# Patient Record
Sex: Female | Born: 1975 | Race: Black or African American | Hispanic: No | Marital: Married | Smoking: Former smoker
Health system: Southern US, Community
[De-identification: ages and names within clinical notes are randomized; demographics above are authoritative.]

## PROBLEM LIST (undated history)

## (undated) DIAGNOSIS — C801 Malignant (primary) neoplasm, unspecified: Secondary | ICD-10-CM

## (undated) DIAGNOSIS — K589 Irritable bowel syndrome without diarrhea: Secondary | ICD-10-CM

## (undated) DIAGNOSIS — L732 Hidradenitis suppurativa: Secondary | ICD-10-CM

## (undated) DIAGNOSIS — G2581 Restless legs syndrome: Secondary | ICD-10-CM

## (undated) HISTORY — PX: CHOLECYSTECTOMY: SHX55

## (undated) HISTORY — DX: Malignant (primary) neoplasm, unspecified: C80.1

## (undated) HISTORY — PX: ABDOMINAL HYSTERECTOMY: SHX81

## (undated) HISTORY — PX: TUBAL LIGATION: SHX77

---

## 1998-11-09 ENCOUNTER — Inpatient Hospital Stay (HOSPITAL_COMMUNITY): Admission: AD | Admit: 1998-11-09 | Discharge: 1998-11-09 | Payer: Self-pay | Admitting: Obstetrics & Gynecology

## 1998-11-13 ENCOUNTER — Ambulatory Visit (HOSPITAL_COMMUNITY): Admission: RE | Admit: 1998-11-13 | Discharge: 1998-11-13 | Payer: Self-pay | Admitting: Obstetrics & Gynecology

## 1998-12-01 ENCOUNTER — Inpatient Hospital Stay (HOSPITAL_COMMUNITY): Admission: AD | Admit: 1998-12-01 | Discharge: 1998-12-01 | Payer: Self-pay | Admitting: Obstetrics & Gynecology

## 1998-12-01 ENCOUNTER — Encounter: Payer: Self-pay | Admitting: Obstetrics & Gynecology

## 1999-01-02 ENCOUNTER — Ambulatory Visit (HOSPITAL_COMMUNITY): Admission: RE | Admit: 1999-01-02 | Discharge: 1999-01-02 | Payer: Self-pay | Admitting: Obstetrics & Gynecology

## 2009-04-05 ENCOUNTER — Emergency Department (HOSPITAL_COMMUNITY): Admission: EM | Admit: 2009-04-05 | Discharge: 2009-04-05 | Payer: Self-pay | Admitting: Emergency Medicine

## 2010-07-10 ENCOUNTER — Emergency Department (HOSPITAL_COMMUNITY): Payer: Self-pay

## 2010-07-10 ENCOUNTER — Emergency Department (HOSPITAL_COMMUNITY)
Admission: EM | Admit: 2010-07-10 | Discharge: 2010-07-10 | Disposition: A | Discharge status: Home / Self Care | Payer: Self-pay | Attending: Emergency Medicine | Admitting: Emergency Medicine

## 2010-07-10 DIAGNOSIS — R1012 Left upper quadrant pain: Secondary | ICD-10-CM | POA: Insufficient documentation

## 2010-07-10 DIAGNOSIS — R5381 Other malaise: Secondary | ICD-10-CM | POA: Insufficient documentation

## 2010-07-10 DIAGNOSIS — IMO0002 Reserved for concepts with insufficient information to code with codable children: Secondary | ICD-10-CM | POA: Insufficient documentation

## 2010-07-10 DIAGNOSIS — R5383 Other fatigue: Secondary | ICD-10-CM | POA: Insufficient documentation

## 2010-07-10 LAB — DIFFERENTIAL
Basophils Absolute: 0 10*3/uL (ref 0.0–0.1)
Basophils Relative: 0 % (ref 0–1)
Eosinophils Absolute: 0.1 10*3/uL (ref 0.0–0.7)
Eosinophils Relative: 2 % (ref 0–5)
Lymphocytes Relative: 21 % (ref 12–46)
Lymphs Abs: 1.3 10*3/uL (ref 0.7–4.0)
Monocytes Absolute: 0.3 10*3/uL (ref 0.1–1.0)
Monocytes Relative: 5 % (ref 3–12)
Neutro Abs: 4.7 10*3/uL (ref 1.7–7.7)
Neutrophils Relative %: 73 % (ref 43–77)

## 2010-07-10 LAB — BASIC METABOLIC PANEL
BUN: 14 mg/dL (ref 6–23)
CO2: 24 mEq/L (ref 19–32)
Chloride: 106 mEq/L (ref 96–112)
Creatinine, Ser: 0.64 mg/dL (ref 0.4–1.2)
GFR calc Af Amer: >60 mL/min (ref >60)

## 2010-07-10 LAB — CBC
HCT: 38.2 % (ref 36.0–46.0)
Hemoglobin: 12.7 g/dL (ref 12.0–15.0)
MCH: 28.7 pg (ref 26.0–34.0)
MCV: 86.4 fL (ref 78.0–100.0)
RBC: 4.42 MIL/uL (ref 3.87–5.11)

## 2011-02-22 ENCOUNTER — Emergency Department (HOSPITAL_COMMUNITY)
Admission: EM | Admit: 2011-02-22 | Discharge: 2011-02-22 | Disposition: A | Discharge status: Home / Self Care | Payer: Self-pay | Attending: Emergency Medicine | Admitting: Emergency Medicine

## 2011-02-22 DIAGNOSIS — R112 Nausea with vomiting, unspecified: Secondary | ICD-10-CM | POA: Insufficient documentation

## 2011-02-22 DIAGNOSIS — R197 Diarrhea, unspecified: Secondary | ICD-10-CM | POA: Insufficient documentation

## 2011-02-22 DIAGNOSIS — R109 Unspecified abdominal pain: Secondary | ICD-10-CM | POA: Insufficient documentation

## 2011-02-22 DIAGNOSIS — G8929 Other chronic pain: Secondary | ICD-10-CM | POA: Insufficient documentation

## 2011-02-22 LAB — DIFFERENTIAL
Basophils Absolute: 0 10*3/uL (ref 0.0–0.1)
Basophils Relative: 0 % (ref 0–1)
Eosinophils Absolute: 0.2 10*3/uL (ref 0.0–0.7)
Eosinophils Relative: 2 % (ref 0–5)
Lymphocytes Relative: 21 % (ref 12–46)
Lymphs Abs: 1.9 10*3/uL (ref 0.7–4.0)
Monocytes Absolute: 0.5 10*3/uL (ref 0.1–1.0)
Monocytes Relative: 6 % (ref 3–12)
Neutro Abs: 6.5 10*3/uL (ref 1.7–7.7)
Neutrophils Relative %: 71 % (ref 43–77)

## 2011-02-22 LAB — URINALYSIS, ROUTINE W REFLEX MICROSCOPIC
Glucose, UA: NEGATIVE mg/dL
Protein, ur: NEGATIVE mg/dL

## 2011-02-22 LAB — CBC
HCT: 40.1 % (ref 36.0–46.0)
Hemoglobin: 13.6 g/dL (ref 12.0–15.0)
MCH: 28.6 pg (ref 26.0–34.0)
MCHC: 33.9 g/dL (ref 30.0–36.0)
MCV: 84.4 fL (ref 78.0–100.0)
Platelets: 300 10*3/uL (ref 150–400)
RBC: 4.75 MIL/uL (ref 3.87–5.11)
RDW: 14.3 % (ref 11.5–15.5)
WBC: 9.1 10*3/uL (ref 4.0–10.5)

## 2011-02-22 LAB — COMPREHENSIVE METABOLIC PANEL
ALT: 11 U/L (ref 0–35)
AST: 14 U/L (ref 0–37)
Albumin: 3.9 g/dL (ref 3.5–5.2)
Alkaline Phosphatase: 72 U/L (ref 39–117)
BUN: 13 mg/dL (ref 6–23)
CO2: 25 mEq/L (ref 19–32)
Calcium: 9.4 mg/dL (ref 8.4–10.5)
Chloride: 103 mEq/L (ref 96–112)
Creatinine, Ser: 0.55 mg/dL (ref 0.50–1.10)
GFR calc Af Amer: >60 mL/min (ref >60)
GFR calc non Af Amer: >60 mL/min (ref >60)
Glucose, Bld: 80 mg/dL (ref 70–99)
Potassium: 3.9 mEq/L (ref 3.5–5.1)
Sodium: 137 mEq/L (ref 135–145)
Total Bilirubin: 0.3 mg/dL (ref 0.3–1.2)
Total Protein: 8 g/dL (ref 6.0–8.3)

## 2011-02-22 LAB — PREGNANCY, URINE: Preg Test, Ur: NEGATIVE

## 2011-02-22 LAB — URINE MICROSCOPIC-ADD ON

## 2012-03-10 ENCOUNTER — Emergency Department (HOSPITAL_BASED_OUTPATIENT_CLINIC_OR_DEPARTMENT_OTHER)
Admission: EM | Admit: 2012-03-10 | Discharge: 2012-03-10 | Disposition: A | Discharge status: Home / Self Care | Payer: Self-pay | Attending: Emergency Medicine | Admitting: Emergency Medicine

## 2012-03-10 ENCOUNTER — Encounter (HOSPITAL_BASED_OUTPATIENT_CLINIC_OR_DEPARTMENT_OTHER): Payer: Self-pay | Admitting: *Deleted

## 2012-03-10 ENCOUNTER — Emergency Department (HOSPITAL_BASED_OUTPATIENT_CLINIC_OR_DEPARTMENT_OTHER): Payer: Self-pay

## 2012-03-10 DIAGNOSIS — Z91013 Allergy to seafood: Secondary | ICD-10-CM | POA: Insufficient documentation

## 2012-03-10 DIAGNOSIS — F17200 Nicotine dependence, unspecified, uncomplicated: Secondary | ICD-10-CM | POA: Insufficient documentation

## 2012-03-10 DIAGNOSIS — W230XXA Caught, crushed, jammed, or pinched between moving objects, initial encounter: Secondary | ICD-10-CM | POA: Insufficient documentation

## 2012-03-10 DIAGNOSIS — S6720XA Crushing injury of unspecified hand, initial encounter: Secondary | ICD-10-CM

## 2012-03-10 DIAGNOSIS — IMO0002 Reserved for concepts with insufficient information to code with codable children: Secondary | ICD-10-CM

## 2012-03-10 DIAGNOSIS — S6990XA Unspecified injury of unspecified wrist, hand and finger(s), initial encounter: Secondary | ICD-10-CM | POA: Insufficient documentation

## 2012-03-10 DIAGNOSIS — Z888 Allergy status to other drugs, medicaments and biological substances status: Secondary | ICD-10-CM | POA: Insufficient documentation

## 2012-03-10 MED ORDER — IBUPROFEN 600 MG PO TABS: 600.0000 mg | ORAL_TABLET | Freq: Four times a day (QID) | ORAL | Status: DC | PRN

## 2012-03-10 MED ORDER — IBUPROFEN 800 MG PO TABS
800.0000 mg | ORAL_TABLET | Freq: Once | ORAL | Status: AC
  Administered 2012-03-10: 800 mg via ORAL
  Filled 2012-03-10: qty 1

## 2012-03-10 NOTE — ED Provider Notes (Signed)
History     CSN: [id number]  Arrival date & time 03/10/12  0015   First MD Initiated Contact with Patient 03/10/12 0034      Chief Complaint  Patient presents with  . Finger Injury    (Consider location/radiation/quality/duration/timing/severity/associated sxs/prior treatment) Patient is a 36 y.o. female presenting with hand injury. The history is provided by the patient.  Hand Injury  The incident occurred less than 1 hour ago. The incident occurred at home. The injury mechanism was compression (closed in a car door the left index and middle finger tips). The pain is present in the left hand. The quality of the pain is described as aching. The pain is severe. The pain has been constant since the incident. Pertinent negatives include no malaise/fatigue. She reports no foreign bodies present. She has tried nothing for the symptoms. The treatment provided no relief.    History reviewed. No pertinent past medical history.  History reviewed. No pertinent past surgical history.  No family history on file.  History  Substance Use Topics  . Smoking status: Current Every Day Smoker  . Smokeless tobacco: Not on file  . Alcohol Use: No    OB History    Grav Para Term Preterm Abortions TAB SAB Ect Mult Living                  Review of Systems  Constitutional: Negative for malaise/fatigue.  Skin: Positive for wound.  All other systems reviewed and are negative.    Allergies  Codeine; Ivp dye; Percocet; and Shellfish allergy  Home Medications  No current outpatient prescriptions on file.  BP 124/72  Pulse 84  Temp 98 F (36.7 C) (Oral)  Resp 18  Ht 5' (1.524 m)  Wt 184 lb (83.462 kg)  BMI 35.94 kg/m2  SpO2 100%  LMP 02/28/2012  Physical Exam  Constitutional: She is oriented to person, place, and time. She appears well-developed and well-nourished. No distress.  HENT:  Head: Normocephalic and atraumatic.  Eyes: Conjunctivae normal are normal. Pupils are equal,  round, and reactive to light.  Neck: Normal range of motion. Neck supple.  Cardiovascular: Normal rate and regular rhythm.   Pulmonary/Chest: Effort normal and breath sounds normal.  Abdominal: Soft. Bowel sounds are normal. There is no tenderness.  Musculoskeletal: Normal range of motion.       Left hand neurovascularly intact cap refill to fingers < 2 sec small superficial lac 9mm on tuft of the left index finger.  Small ecchymosis at cuticle of left index finger  FROM of the digits.  No snuff box tenderness  Neurological: She is alert and oriented to person, place, and time.  Skin: Skin is warm and dry.  Psychiatric: She has a normal mood and affect.    ED Course  Procedures (including critical care time)  Labs Reviewed - No data to display No results found.   No diagnosis found.    MDM  LACERATION REPAIR Performed by: Jasmine Awe Authorized by: Jasmine Awe Consent: Verbal consent obtained. Risks and benefits: risks, benefits and alternatives were discussed Consent given by: patient Patient identity confirmed: provided demographic data Prepped and Draped in normal sterile fashion Wound explored  Laceration Location: left index finger tuft  Laceration Length: . 9 cm  No Foreign Bodies seen or palpated   Irrigation method: syringe Amount of cleaning: standard  Skin closure: dermabond   Patient tolerance: Patient tolerated the procedure well with no immediate complications.   Will give follow up with  hand.  No indication for nail trephination at this time.  Ice and elevate     Cherylin Waguespack K Rhythm Gubbels-Rasch, MD 03/10/12 0120

## 2012-03-10 NOTE — ED Notes (Addendum)
Pt. States that she slammed her left pointer finger in her door. Swelling noted with redness to the left finger tip and pain with movment. Small less than one inch open area noted to ant aspect of finger.  Also, states left middle finger was slammed in the door as well. No injury to that finger per pt.

## 2012-03-30 ENCOUNTER — Emergency Department (HOSPITAL_BASED_OUTPATIENT_CLINIC_OR_DEPARTMENT_OTHER)
Admission: EM | Admit: 2012-03-30 | Discharge: 2012-03-30 | Disposition: A | Discharge status: Home / Self Care | Payer: Self-pay | Attending: Emergency Medicine | Admitting: Emergency Medicine

## 2012-03-30 ENCOUNTER — Encounter (HOSPITAL_BASED_OUTPATIENT_CLINIC_OR_DEPARTMENT_OTHER): Payer: Self-pay | Admitting: *Deleted

## 2012-03-30 DIAGNOSIS — K029 Dental caries, unspecified: Secondary | ICD-10-CM | POA: Insufficient documentation

## 2012-03-30 DIAGNOSIS — F17200 Nicotine dependence, unspecified, uncomplicated: Secondary | ICD-10-CM | POA: Insufficient documentation

## 2012-03-30 MED ORDER — IBUPROFEN 800 MG PO TABS: 800.0000 mg | ORAL_TABLET | Freq: Three times a day (TID) | ORAL | Status: DC

## 2012-03-30 MED ORDER — PENICILLIN V POTASSIUM 500 MG PO TABS: 500.0000 mg | ORAL_TABLET | Freq: Four times a day (QID) | ORAL | Status: AC

## 2012-03-30 NOTE — ED Notes (Signed)
C/o right upper tooth pain x 2 days.

## 2012-03-30 NOTE — ED Provider Notes (Signed)
History     CSN: [id number]  Arrival date & time 03/30/12  0121   First MD Initiated Contact with Patient 03/30/12 0134      Chief Complaint  Patient presents with  . Dental Pain    (Consider location/radiation/quality/duration/timing/severity/associated sxs/prior treatment) Patient is a 36 y.o. female presenting with tooth pain. The history is provided by the patient. No language interpreter was used.  Dental PainThe primary symptoms include mouth pain and dental injury. Primary symptoms do not include fever. The symptoms began 2 days ago. The symptoms are worsening. The symptoms are new. The symptoms occur constantly.  Additional symptoms do not include: gum swelling and facial swelling. Medical issues include: smoking. Medical issues do not include: alcohol problem.  Cavity in right upper first molar  History reviewed. No pertinent past medical history.  History reviewed. No pertinent past surgical history.  History reviewed. No pertinent family history.  History  Substance Use Topics  . Smoking status: Current Every Day Smoker  . Smokeless tobacco: Not on file  . Alcohol Use: No    OB History    Grav Para Term Preterm Abortions TAB SAB Ect Mult Living                  Review of Systems  Constitutional: Negative for fever.  HENT: Negative for facial swelling, neck pain and neck stiffness.   All other systems reviewed and are negative.    Allergies  Codeine; Ivp dye; Percocet; and Shellfish allergy  Home Medications   Current Outpatient Rx  Name Route Sig Dispense Refill  . IBUPROFEN 600 MG PO TABS Oral Take 1 tablet (600 mg total) by mouth every 6 (six) hours as needed for pain. 30 tablet 0    LMP 02/28/2012  Physical Exam  Constitutional: She is oriented to person, place, and time. She appears well-developed and well-nourished. No distress.  HENT:  Head: Normocephalic and atraumatic. No trismus in the jaw.  Mouth/Throat: Dental caries present. No  dental abscesses or uvula swelling.  Eyes: Conjunctivae normal are normal. Pupils are equal, round, and reactive to light.  Neck: Normal range of motion. Neck supple.  Cardiovascular: Normal rate and regular rhythm.   Pulmonary/Chest: Effort normal and breath sounds normal. She has no wheezes.  Abdominal: Soft. Bowel sounds are normal. There is no tenderness.  Musculoskeletal: Normal range of motion.  Neurological: She is alert and oriented to person, place, and time.  Skin: Skin is warm and dry.  Psychiatric: She has a normal mood and affect.    ED Course  Procedures (including critical care time)  Labs Reviewed - No data to display No results found.   No diagnosis found.    MDM  Follow up with a dentist for ongoing care        Aneka Fagerstrom K Fardeen Steinberger-Rasch, MD 03/30/12 [id number]

## 2013-05-14 ENCOUNTER — Encounter (HOSPITAL_COMMUNITY): Payer: Self-pay | Admitting: Emergency Medicine

## 2013-05-14 ENCOUNTER — Emergency Department (HOSPITAL_COMMUNITY): Payer: PRIVATE HEALTH INSURANCE

## 2013-05-14 ENCOUNTER — Observation Stay (HOSPITAL_COMMUNITY)
Admission: EM | Admit: 2013-05-14 | Discharge: 2013-05-15 | Disposition: A | Discharge status: Home / Self Care | Payer: PRIVATE HEALTH INSURANCE | Attending: Internal Medicine | Admitting: Internal Medicine

## 2013-05-14 DIAGNOSIS — R112 Nausea with vomiting, unspecified: Secondary | ICD-10-CM | POA: Insufficient documentation

## 2013-05-14 DIAGNOSIS — E669 Obesity, unspecified: Secondary | ICD-10-CM | POA: Insufficient documentation

## 2013-05-14 DIAGNOSIS — G43909 Migraine, unspecified, not intractable, without status migrainosus: Secondary | ICD-10-CM | POA: Insufficient documentation

## 2013-05-14 DIAGNOSIS — F17200 Nicotine dependence, unspecified, uncomplicated: Secondary | ICD-10-CM | POA: Diagnosis present

## 2013-05-14 DIAGNOSIS — R0789 Other chest pain: Secondary | ICD-10-CM | POA: Diagnosis present

## 2013-05-14 DIAGNOSIS — R262 Difficulty in walking, not elsewhere classified: Secondary | ICD-10-CM | POA: Insufficient documentation

## 2013-05-14 DIAGNOSIS — R42 Dizziness and giddiness: Principal | ICD-10-CM | POA: Diagnosis present

## 2013-05-14 LAB — CBC WITH DIFFERENTIAL/PLATELET
Basophils Relative: 0 % (ref 0–1)
HCT: 38.3 % (ref 36.0–46.0)
Hemoglobin: 12.8 g/dL (ref 12.0–15.0)
Lymphocytes Relative: 13 % (ref 12–46)
MCHC: 33.4 g/dL (ref 30.0–36.0)
Monocytes Relative: 5 % (ref 3–12)
Neutro Abs: 7.3 10*3/uL (ref 1.7–7.7)
Neutrophils Relative %: 80 % — ABNORMAL HIGH (ref 43–77)
RBC: 4.79 MIL/uL (ref 3.87–5.11)
WBC: 9.1 10*3/uL (ref 4.0–10.5)

## 2013-05-14 LAB — COMPREHENSIVE METABOLIC PANEL
ALT: 13 U/L (ref 0–35)
Albumin: 3.5 g/dL (ref 3.5–5.2)
Alkaline Phosphatase: 83 U/L (ref 39–117)
Chloride: 99 mEq/L (ref 96–112)
GFR calc Af Amer: >90 mL/min (ref >90)
Glucose, Bld: 103 mg/dL — ABNORMAL HIGH (ref 70–99)
Potassium: 3.9 mEq/L (ref 3.5–5.1)
Sodium: 137 mEq/L (ref 135–145)
Total Bilirubin: 0.2 mg/dL — ABNORMAL LOW (ref 0.3–1.2)
Total Protein: 7.7 g/dL (ref 6.0–8.3)

## 2013-05-14 LAB — CBC
HCT: 37.1 % (ref 36.0–46.0)
Hemoglobin: 12.2 g/dL (ref 12.0–15.0)
MCH: 26 pg (ref 26.0–34.0)
MCHC: 32.9 g/dL (ref 30.0–36.0)
RDW: 14.9 % (ref 11.5–15.5)
WBC: 9.2 10*3/uL (ref 4.0–10.5)

## 2013-05-14 LAB — URINALYSIS, ROUTINE W REFLEX MICROSCOPIC
Bilirubin Urine: NEGATIVE
Specific Gravity, Urine: 1.015 (ref 1.005–1.030)
pH: 6.5 (ref 5.0–8.0)

## 2013-05-14 LAB — URINE MICROSCOPIC-ADD ON

## 2013-05-14 LAB — TROPONIN I: Troponin I: <0.3 ng/mL (ref <0.30)

## 2013-05-14 LAB — CREATININE, SERUM
GFR calc Af Amer: >90 mL/min (ref >90)
GFR calc non Af Amer: >90 mL/min (ref >90)

## 2013-05-14 MED ORDER — SODIUM CHLORIDE 0.9 % IV BOLUS (SEPSIS)
1000.0000 mL | Freq: Once | INTRAVENOUS | Status: AC
  Administered 2013-05-14: 1000 mL via INTRAVENOUS

## 2013-05-14 MED ORDER — ONDANSETRON HCL 4 MG PO TABS
4.0000 mg | ORAL_TABLET | Freq: Four times a day (QID) | ORAL | Status: DC | PRN
  Administered 2013-05-15 (×2): 4 mg via ORAL
  Filled 2013-05-14 (×2): qty 1

## 2013-05-14 MED ORDER — HYDROCORTISONE SOD SUCCINATE 100 MG IJ SOLR
200.0000 mg | Freq: Once | INTRAMUSCULAR | Status: AC
  Administered 2013-05-14: 200 mg via INTRAVENOUS
  Filled 2013-05-14: qty 4

## 2013-05-14 MED ORDER — DIPHENHYDRAMINE HCL 50 MG/ML IJ SOLN
25.0000 mg | Freq: Once | INTRAMUSCULAR | Status: AC
  Administered 2013-05-14: 25 mg via INTRAVENOUS
  Filled 2013-05-14: qty 1

## 2013-05-14 MED ORDER — LORAZEPAM 2 MG/ML IJ SOLN
0.5000 mg | Freq: Once | INTRAMUSCULAR | Status: AC
  Administered 2013-05-14: 0.5 mg via INTRAVENOUS
  Filled 2013-05-14: qty 1

## 2013-05-14 MED ORDER — HEPARIN SODIUM (PORCINE) 5000 UNIT/ML IJ SOLN
5000.0000 [IU] | Freq: Three times a day (TID) | INTRAMUSCULAR | Status: DC
  Administered 2013-05-14 – 2013-05-15 (×2): 5000 [IU] via SUBCUTANEOUS
  Filled 2013-05-14 (×5): qty 1

## 2013-05-14 MED ORDER — IOHEXOL 350 MG/ML SOLN
100.0000 mL | Freq: Once | INTRAVENOUS | Status: AC | PRN
  Administered 2013-05-14: 100 mL via INTRAVENOUS

## 2013-05-14 MED ORDER — DIAZEPAM 2 MG PO TABS
1.0000 mg | ORAL_TABLET | Freq: Four times a day (QID) | ORAL | Status: DC
  Administered 2013-05-15 (×2): 1 mg via ORAL
  Filled 2013-05-14 (×2): qty 1

## 2013-05-14 MED ORDER — LORAZEPAM 2 MG/ML IJ SOLN
1.0000 mg | Freq: Once | INTRAMUSCULAR | Status: AC
  Administered 2013-05-14: 1 mg via INTRAVENOUS
  Filled 2013-05-14: qty 1

## 2013-05-14 MED ORDER — ONDANSETRON HCL 4 MG/2ML IJ SOLN
4.0000 mg | Freq: Four times a day (QID) | INTRAMUSCULAR | Status: DC | PRN
  Administered 2013-05-14: 4 mg via INTRAVENOUS
  Filled 2013-05-14: qty 2

## 2013-05-14 MED ORDER — TRAMADOL HCL 50 MG PO TABS
50.0000 mg | ORAL_TABLET | Freq: Four times a day (QID) | ORAL | Status: DC | PRN
  Administered 2013-05-14 – 2013-05-15 (×2): 50 mg via ORAL
  Filled 2013-05-14 (×2): qty 1

## 2013-05-14 MED ORDER — METOCLOPRAMIDE HCL 5 MG/ML IJ SOLN
5.0000 mg | Freq: Once | INTRAMUSCULAR | Status: AC
  Administered 2013-05-14: 5 mg via INTRAVENOUS
  Filled 2013-05-14: qty 2

## 2013-05-14 MED ORDER — NICOTINE 14 MG/24HR TD PT24
14.0000 mg | MEDICATED_PATCH | Freq: Every day | TRANSDERMAL | Status: DC
  Administered 2013-05-14 – 2013-05-15 (×2): 14 mg via TRANSDERMAL
  Filled 2013-05-14 (×3): qty 1

## 2013-05-14 MED ORDER — MECLIZINE HCL 25 MG PO TABS
25.0000 mg | ORAL_TABLET | Freq: Once | ORAL | Status: AC
  Administered 2013-05-14: 25 mg via ORAL
  Filled 2013-05-14: qty 1

## 2013-05-14 MED ORDER — SODIUM CHLORIDE 0.9 % IV SOLN
INTRAVENOUS | Status: DC
  Administered 2013-05-14: 18:00:00 via INTRAVENOUS

## 2013-05-14 NOTE — ED Provider Notes (Signed)
CSN: [id number]     Arrival date & time 05/14/13  7829 History   First MD Initiated Contact with Patient 05/14/13 0725     Chief Complaint  Patient presents with  . Dizziness  . Nausea  . Emesis   (Consider location/radiation/quality/duration/timing/severity/associated sxs/prior Treatment) Patient is a 37 y.o. female presenting with vomiting. The history is provided by the patient.  Emesis  patient complaining of sudden onset of dizziness described as room spinning with out associated headache. She then developed substernal chest pressure which is been constant and nonradiating which then progressed to nausea and vomiting. 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 Denies any lower leg pain swelling or edema. No anginal type symptoms. No recent fever or cough. Denies any night sweats. Nothing makes her symptoms better or worse and no treatment used prior to arrival  History reviewed. No pertinent past medical history. History reviewed. No pertinent past surgical history. No family history on file. History  Substance Use Topics  . Smoking status: Current Every Day Smoker  . Smokeless tobacco: Not on file  . Alcohol Use: No   OB History   Grav Para Term Preterm Abortions TAB SAB Ect Mult Living                 Review of Systems  Gastrointestinal: Positive for vomiting.  All other systems reviewed and are negative.    Allergies  Codeine; Ivp dye; Percocet; and Shellfish allergy  Home Medications   Current Outpatient Rx  Name  Route  Sig  Dispense  Refill  . traMADol (ULTRAM) 50 MG tablet   Oral   Take 50 mg by mouth every 6 (six) hours as needed.          BP 133/81  Pulse 63  Temp(Src) 98.9 F (37.2 C) (Oral)  Resp 18  SpO2 100% Physical Exam  Nursing note and vitals reviewed. Constitutional: She is oriented to person, place, and time. She appears well-developed and well-nourished.  Non-toxic appearance. No distress.  HENT:   Head: Normocephalic and atraumatic.  Eyes: Conjunctivae, EOM and lids are normal. Pupils are equal, round, and reactive to light.  Neck: Normal range of motion. Neck supple. No tracheal deviation present. No mass present.  Cardiovascular: Normal rate, regular rhythm and normal heart sounds.  Exam reveals no gallop.   No murmur heard. Pulmonary/Chest: Effort normal and breath sounds normal. No stridor. No respiratory distress. She has no decreased breath sounds. She has no wheezes. She has no rhonchi. She has no rales.  Abdominal: Soft. Normal appearance and bowel sounds are normal. She exhibits no distension. There is no tenderness. There is no rebound and no CVA tenderness.  Musculoskeletal: Normal range of motion. She exhibits no edema and no tenderness.  Neurological: She is alert and oriented to person, place, and time. She has normal strength. No cranial nerve deficit or sensory deficit. GCS eye subscore is 4. GCS verbal subscore is 5. GCS motor subscore is 6.  Skin: Skin is warm and dry. No abrasion and no rash noted.  Psychiatric: She has a normal mood and affect. Her speech is normal and behavior is normal.    ED Course  Procedures (including critical care time) Labs Review Labs Reviewed  CBC WITH DIFFERENTIAL  TROPONIN I  COMPREHENSIVE METABOLIC PANEL  LIPASE, BLOOD  URINALYSIS, ROUTINE W REFLEX MICROSCOPIC   Imaging Review No results found.  EKG Interpretation    Date/Time:  Monday May 14 2013 08:13:55  EST Ventricular Rate:  82 PR Interval:  128 QRS Duration: 100 QT Interval:  395 QTC Calculation: 461 R Axis:   -23 Text Interpretation:  Sinus rhythm Borderline left axis deviation Low voltage, precordial leads Confirmed by Areil Ottey  MD, Aengus Sauceda ([id number]) on 05/14/2013 8:18:16 AM            MDM  No diagnosis found. Patient treated for dizziness and she remains symptomatic. I have given her IV fluids, Ativan, Antivert. Patient because of her chest pain and  possible history of lymphoma had a chest CT which was negative for PE. I also subsequently did a CT of her head as well as MRI of her brain which were negative for stroke or tumor. I attempted to ambulate the patient and she was ataxic. Patient will require inpatient admission and I have discussed this with the hospitalist    Toy Baker, MD 05/14/13 [id number]

## 2013-05-14 NOTE — Progress Notes (Signed)
UR completed 

## 2013-05-14 NOTE — Progress Notes (Addendum)
   CARE MANAGEMENT ED NOTE 05/14/2013  Patient:  Jillian Baker, Jillian Baker   Account Number:  [id number]  Date Initiated:  05/14/2013  Documentation initiated by:  Edd Arbour  Subjective/Objective Assessment:   37 yr old female [employer] pt without pcp listed in EPIC States pcp is providers at [facility] urgent care     Subjective/Objective Assessment Detail:     Action/Plan:   EPIC updated    Action/Plan Detail:   Anticipated DC Date:  05/15/2013     Status Recommendation to Physician:   Result of Recommendation:    Other ED Services  Consult Working Plan    DC Planning Services  Other  PCP issues  Outpatient Services - Pt will follow up    Choice offered to / List presented to:            Status of service:  Completed, signed off  ED Comments:   ED Comments Detail:

## 2013-05-14 NOTE — Consult Note (Signed)
NEURO HOSPITALIST CONSULT NOTE    Reason for Consult: Dizziness and feeling as though she is falling to the right  HPI:                                                                                                                                          Jillian Baker is an 37 y.o. female who states she has "two lumps on her breat followed as a out patient by [facility]".  She is scheduled for a follow up imaging in three months.  Last night she bent over and upon sitting up right she felt "dizzy and then noted room was moving from left to right for about 2-3 minute".  She had just taken a Tramadol and fell asleep.  She did not wake up during the night and was not bothered by vertigo or dizziness.  Upon waking "she could not get up due to feeling dizzy" but no vertigo.  She finally sat up and started to walk to the shower.  On her way she felt as though she was falling to the right.  She states she did not fall but did lean into the right door frame.  In the shower she felt as though she was falling to the right.  During this time she denies vertigo. She finished showering and sat on her bed--at this time she noted she ad vertigo --right to left. Currently she is not having vertigo or dizziness but when she stands she feels she cannot stay upright with eyes open or closed. MRI brain with coronal DWI was obtained showed no acute infarct of either cerebellum, brainstem or cortical region.   History reviewed. No pertinent past medical history.  Past Surgical History  Procedure Laterality Date  . Tubal ligation      Family History  Problem Relation Age of Onset  . Asthma Mother   . COPD Father   . Pancreatic cancer Father   . Breast cancer Paternal Aunt   . Schizophrenia Paternal Aunt      Social History:  reports that she has been smoking Cigarettes.  She has been smoking about 0.00 packs per day. She has never used smokeless tobacco. She reports that she does  not drink alcohol or use illicit drugs.  Allergies  Allergen Reactions  . Codeine   . Ivp Dye [Iodinated Diagnostic Agents]     05-14-13-----pt pre-medicated (ER) short prep per Dr Freida Busman   . Percocet [Oxycodone-Acetaminophen]   . Shellfish Allergy     MEDICATIONS:  Current Facility-Administered Medications  Medication Dose Route Frequency Provider Last Rate Last Dose  . 0.9 %  sodium chloride infusion   Intravenous Continuous Toy Baker, MD       Current Outpatient Prescriptions  Medication Sig Dispense Refill  . traMADol (ULTRAM) 50 MG tablet Take 50 mg by mouth every 6 (six) hours as needed.          ROS:                                                                                                                                       History obtained from the patient  General ROS: negative for - chills, fatigue, fever, night sweats, weight gain or weight loss Psychological ROS: negative for - behavioral disorder, hallucinations, memory difficulties, mood swings or suicidal ideation Ophthalmic ROS: negative for - blurry vision, double vision, eye pain or loss of vision ENT ROS: negative for - epistaxis, nasal discharge, oral lesions, sore throat, tinnitus or vertigo Allergy and Immunology ROS: negative for - hives or itchy/watery eyes Hematological and Lymphatic ROS: negative for - bleeding problems, bruising or swollen lymph nodes Endocrine ROS: negative for - galactorrhea, hair pattern changes, polydipsia/polyuria or temperature intolerance Respiratory ROS: negative for - cough, hemoptysis, shortness of breath or wheezing Cardiovascular ROS: negative for - chest pain, dyspnea on exertion, edema or irregular heartbeat Gastrointestinal ROS: negative for - abdominal pain, diarrhea, hematemesis, nausea/vomiting or stool incontinence Genito-Urinary ROS:  negative for - dysuria, hematuria, incontinence or urinary frequency/urgency Musculoskeletal ROS: negative for - joint swelling or muscular weakness Neurological ROS: as noted in HPI Dermatological ROS: negative for rash and skin lesion changes   Blood pressure 117/76, pulse 96, temperature 98.2 F (36.8 C), temperature source Oral, resp. rate 16, last menstrual period 04/11/2013, SpO2 99.00%.   Neurologic Examination:                                                                                                      Mental Status: Alert, oriented, thought content appropriate.  Speech fluent without evidence of aphasia.  Able to follow 3 step commands without difficulty. Cranial Nerves: II: Discs flat bilaterally; Visual fields grossly normal, pupils equal, round, reactive to light and accommodation III,IV, VI: ptosis not present, extra-ocular motions intact bilaterally, no nystagmus noted V,VII: smile symmetric, facial light touch sensation normal bilaterally VIII: hearing normal bilaterally IX,X: gag reflex present XI: bilateral shoulder shrug XII: midline tongue extension without atrophy or fasciculations  Motor: Right :  Upper extremity   5/5    Left:     Upper extremity   5/5  Lower extremity   5/5     Lower extremity   5/5 Tone and bulk:normal tone throughout; no atrophy noted Sensory: Pinprick and light touch intact throughout, bilaterally Deep Tendon Reflexes:  Right: Upper Extremity   Left: Upper extremity   biceps (C-5 to C-6) 2/4   biceps (C-5 to C-6) 2/4 tricep (C7) 2/4    triceps (C7) 2/4 Brachioradialis (C6) 2/4  Brachioradialis (C6) 2/4  Lower Extremity Lower Extremity  quadriceps (L-2 to L-4) 2/4   quadriceps (L-2 to L-4) 2/4 Achilles (S1) 2/4   Achilles (S1) 2/4  Plantars: Right: downgoing   Left: downgoing Cerebellar: normal finger-to-nose,  normal heel-to-shin test Gait: lists to the right, falls to the right with Romberg, falls to the right with tandem  gait.  Able to sit upright in bed without falling to the right.  CV: pulses palpable throughout    No results found for this basename: cbc, bmp, coags, chol, tri, ldl, hga1c    Results for orders placed during the hospital encounter of 05/14/13 (from the past 48 hour(s))  CBC WITH DIFFERENTIAL     Status: Abnormal   Collection Time    05/14/13  7:55 AM      Result Value Range   WBC 9.1  4.0 - 10.5 K/uL   RBC 4.79  3.87 - 5.11 MIL/uL   Hemoglobin 12.8  12.0 - 15.0 g/dL   HCT 40.9  81.1 - 91.4 %   MCV 80.0  78.0 - 100.0 fL   MCH 26.7  26.0 - 34.0 pg   MCHC 33.4  30.0 - 36.0 g/dL   RDW 78.2  95.6 - 21.3 %   Platelets 369  150 - 400 K/uL   Neutrophils Relative % 80 (*) 43 - 77 %   Neutro Abs 7.3  1.7 - 7.7 K/uL   Lymphocytes Relative 13  12 - 46 %   Lymphs Abs 1.2  0.7 - 4.0 K/uL   Monocytes Relative 5  3 - 12 %   Monocytes Absolute 0.5  0.1 - 1.0 K/uL   Eosinophils Relative 2  0 - 5 %   Eosinophils Absolute 0.2  0.0 - 0.7 K/uL   Basophils Relative 0  0 - 1 %   Basophils Absolute 0.0  0.0 - 0.1 K/uL  TROPONIN I     Status: None   Collection Time    05/14/13  7:55 AM      Result Value Range   Troponin I <0.30  <0.30 ng/mL   Comment:            Due to the release kinetics of cTnI,     a negative result within the first hours     of the onset of symptoms does not rule out     myocardial infarction with certainty.     If myocardial infarction is still suspected,     repeat the test at appropriate intervals.  COMPREHENSIVE METABOLIC PANEL     Status: Abnormal   Collection Time    05/14/13  7:55 AM      Result Value Range   Sodium 137  135 - 145 mEq/L   Potassium 3.9  3.5 - 5.1 mEq/L   Chloride 99  96 - 112 mEq/L   CO2 25  19 - 32 mEq/L   Glucose, Bld 103 (*) 70 - 99 mg/dL   BUN  13  6 - 23 mg/dL   Creatinine, Ser 1.61  0.50 - 1.10 mg/dL   Calcium 9.2  8.4 - 09.6 mg/dL   Total Protein 7.7  6.0 - 8.3 g/dL   Albumin 3.5  3.5 - 5.2 g/dL   AST 16  0 - 37 U/L   ALT 13  0 -  35 U/L   Alkaline Phosphatase 83  39 - 117 U/L   Total Bilirubin 0.2 (*) 0.3 - 1.2 mg/dL   GFR calc non Af Amer >90  >90 mL/min   GFR calc Af Amer >90  >90 mL/min   Comment: (NOTE)     The eGFR has been calculated using the CKD EPI equation.     This calculation has not been validated in all clinical situations.     eGFR's persistently <90 mL/min signify possible Chronic Kidney     Disease.  LIPASE, BLOOD     Status: None   Collection Time    05/14/13  7:55 AM      Result Value Range   Lipase 16  11 - 59 U/L  URINALYSIS, ROUTINE W REFLEX MICROSCOPIC     Status: Abnormal   Collection Time    05/14/13  9:19 AM      Result Value Range   Color, Urine YELLOW  YELLOW   APPearance CLEAR  CLEAR   Specific Gravity, Urine 1.015  1.005 - 1.030   pH 6.5  5.0 - 8.0   Glucose, UA NEGATIVE  NEGATIVE mg/dL   Hgb urine dipstick TRACE (*) NEGATIVE   Bilirubin Urine NEGATIVE  NEGATIVE   Ketones, ur NEGATIVE  NEGATIVE mg/dL   Protein, ur NEGATIVE  NEGATIVE mg/dL   Urobilinogen, UA 0.2  0.0 - 1.0 mg/dL   Nitrite NEGATIVE  NEGATIVE   Leukocytes, UA NEGATIVE  NEGATIVE  URINE MICROSCOPIC-ADD ON     Status: Abnormal   Collection Time    05/14/13  9:19 AM      Result Value Range   Squamous Epithelial / LPF RARE  RARE   WBC, UA 0-2  <3 WBC/hpf   RBC / HPF 0-2  <3 RBC/hpf   Bacteria, UA FEW (*) RARE   Urine-Other MUCOUS PRESENT    POCT PREGNANCY, URINE     Status: None   Collection Time    05/14/13  9:24 AM      Result Value Range   Preg Test, Ur NEGATIVE  NEGATIVE   Comment:            THE SENSITIVITY OF THIS     METHODOLOGY IS >24 mIU/mL    Ct Head Wo Contrast  05/14/2013   CLINICAL DATA:  Headache, dizziness, nausea  EXAM: CT HEAD WITHOUT CONTRAST  TECHNIQUE: Contiguous axial images were obtained from the base of the skull through the vertex without intravenous contrast.  COMPARISON:  11/08/2008.  FINDINGS: No skull fracture is noted. Paranasal sinuses and mastoid air cells are  unremarkable. No intracranial hemorrhage, mass effect or midline shift. No hydrocephalus. The gray and white-matter differentiation is preserved. No acute infarction. No mass lesion is noted on this unenhanced scan.  IMPRESSION: No acute intracranial abnormality.   Electronically Signed   By: Natasha Mead M.D.   On: 05/14/2013 12:34   Ct Angio Chest Pe W/cm &/or Wo Cm  05/14/2013   CLINICAL DATA:  Chest pain, possible PE, dizziness  EXAM: CT ANGIOGRAPHY CHEST WITH CONTRAST  TECHNIQUE: Multidetector CT imaging of the chest was performed using the  standard protocol during bolus administration of intravenous contrast. Multiplanar CT image reconstructions including MIPs were obtained to evaluate the vascular anatomy.  CONTRAST:  OMNIPAQUE IOHEXOL 350 MG/ML SOLN  COMPARISON:  11/29/2012  FINDINGS: Sagittal images of the spine shows mild degenerative changes thoracic spine. No pulmonary embolus is noted. Central airways are patent. Images of the thoracic inlet are unremarkable. No mediastinal or hilar adenopathy. No mediastinal hematoma. Central pulmonary artery is unremarkable. No destructive bony lesions are noted. No rib lesions are identified.  Visualized upper abdomen is unremarkable.  Images of the lung parenchyma shows no acute infiltrate or pulmonary edema. No pneumothorax. No pulmonary nodules.  Review of the MIP images confirms the above findings.  IMPRESSION: 1. No pulmonary embolus.  Mild degenerative changes thoracic spine. 2. No mediastinal hematoma or adenopathy. 3. No acute infiltrate or pulmonary edema.   Electronically Signed   By: Natasha Mead M.D.   On: 05/14/2013 10:00   Mr Brain Wo Contrast  05/14/2013   CLINICAL DATA:  37 year old female with dizziness, nausea vomiting, difficulty walking x2 days. Initial encounter.  EXAM: MRI HEAD WITHOUT CONTRAST  TECHNIQUE: Multiplanar, multiecho pulse sequences of the brain and surrounding structures were obtained without intravenous contrast.   COMPARISON:  Head CTs 05/14/2013 and earlier.  FINDINGS: Cerebral volume is normal. No restricted diffusion to suggest acute infarction. No midline shift, mass effect, evidence of mass lesion, ventriculomegaly, extra-axial collection or acute intracranial hemorrhage. Cervicomedullary junction and pituitary are within normal limits. Negative visualized cervical spine. Intermittent motion artifact. Major intracranial vascular flow voids are preserved. Wallace Cullens and white matter signal is within normal limits throughout the brain.  Internal auditory structures are motion degraded, grossly normal. Mastoids are clear.  Paranasal sinuses are clear. Grossly normal orbits soft tissues. Normal bone marrow signal. Negative scalp soft tissues.  IMPRESSION: Negative noncontrast brain MRI, intermittently degraded by motion artifact.   Electronically Signed   By: Augusto Gamble M.D.   On: 05/14/2013 14:39    Assessment and plan per attending neurologist  Felicie Morn PA-C [facility] [phone]  05/14/2013, 4:33 PM   Assessment/Plan: 37 YO female with new onset dizziness and vertigo associated with sensation of falling to the right. MRI has been obtained and shows no acute infarct. Likely peripheral/vestibular in origin.    Recommend 1) low dose Valium 1 mg Q 6 hours PO for symptomatic treatment 2) PT/OT vestibular rehab.    I personally participated in this patient's evaluation and management, including formulating the above clinical impression and management recommendations.  Venetia Maxon M.D. [facility] [phone]

## 2013-05-14 NOTE — H&P (Signed)
[facility] History and Physical  Jillian Baker ZOX:[id number] DOB: 01-Nov-1975 DOA: 05/14/2013  Referring physician: Dr. Freida Busman PCP: No primary provider on file.   Chief Complaint: Vertigo  HPI: Jillian Baker is a 37 y.o. female Previously healthy African American female with smoking history of one half pack per day and obesity who presented with sudden onset of vertigo and chest discomfort. The symptoms started today and since onset has been present. Nothing she is aware of makes it better or worse. Has also been associated with chest discomfort that she describes as a pressure sensation worse with movement. The chest discomfort started today as well. Currently patient comfortable and denies any radiation. He points to her mid chest when describing location. She denies any fever, cough, hemoptysis, or recent upper respiratory infections.  In the emergency department had extensive workup including CT and MRI of the head/brain with no acute findings that could explain vertigo. Also had troponin drawn which was negative. White blood cell count was within normal limits and vital signs were within normal limits as well. We will consult at for admission given the persistent vertigo and difficulty with ambulation.  Given complaints of chest discomfort patient had CT angiogram of chest which was negative for PE, infiltrate , pulmonary edema.  Review of Systems:  Constitutional:  No weight loss, night sweats, Fevers, chills, fatigue.  HEENT:  No headaches, Difficulty swallowing,Tooth/dental problems,Sore throat,  No sneezing, itching, ear ache, nasal congestion, post nasal drip,  Cardio-vascular:  No chest pain, Orthopnea, PND, swelling in lower extremities, anasarca, dizziness, palpitations  GI:  No heartburn, indigestion, abdominal pain, nausea, vomiting, diarrhea, change in bowel habits, loss of appetite  Resp:  No shortness of breath with exertion or at rest. No excess mucus, no  productive cough, No non-productive cough, No coughing up of blood.No change in color of mucus.No wheezing.No chest wall deformity  Skin:  no rash or lesions.  GU:  no dysuria, change in color of urine, no urgency or frequency. No flank pain.  Musculoskeletal:  No joint pain or swelling. No decreased range of motion. No back pain.  Psych:  No change in mood or affect. No depression or anxiety. No memory loss.   History reviewed. No pertinent past medical history. History reviewed. No pertinent past surgical history. Social History:  reports that she has been smoking.  She does not have any smokeless tobacco history on file. She reports that she does not drink alcohol or use illicit drugs.  Allergies  Allergen Reactions  . Codeine   . Ivp Dye [Iodinated Diagnostic Agents]     05-14-13-----pt pre-medicated (ER) short prep per Dr Freida Busman   . Percocet [Oxycodone-Acetaminophen]   . Shellfish Allergy     No family history on file.   Prior to Admission medications   Medication Sig Start Date End Date Taking? Authorizing Provider  traMADol (ULTRAM) 50 MG tablet Take 50 mg by mouth every 6 (six) hours as needed.   Yes Historical Provider, MD   Physical Exam: Filed Vitals:   05/14/13 1237  BP: 117/76  Pulse: 96  Temp: 98.2 F (36.8 C)  Resp: 16    BP 117/76  Pulse 96  Temp(Src) 98.2 F (36.8 C) (Oral)  Resp 16  SpO2 99%  LMP 04/11/2013  General: Alert, awake, oriented x3, in no acute distress. Head: Atraumatic normocephalic Eyes: Nonicteric, extraocular movements intact Nose: No rhinorrhea, normal exterior appearance Neck: Supple, no goiter Heart: Regular rate and rhythm, without murmurs, rubs, gallops. Lungs: Clear  to auscultation bilaterally. Abdomen: Soft, nontender, nondistended, positive bowel sounds. Extremities: No clubbing cyanosis or edema with positive pedal pulses. Neuro: Grossly intact, nonfocal.             Labs on Admission:  Basic Metabolic  Panel:  Recent Labs Lab 05/14/13 0755  NA 137  K 3.9  CL 99  CO2 25  GLUCOSE 103*  BUN 13  CREATININE 0.68  CALCIUM 9.2   Liver Function Tests:  Recent Labs Lab 05/14/13 0755  AST 16  ALT 13  ALKPHOS 83  BILITOT 0.2*  PROT 7.7  ALBUMIN 3.5    Recent Labs Lab 05/14/13 0755  LIPASE 16   No results found for this basename: AMMONIA,  in the last 168 hours CBC:  Recent Labs Lab 05/14/13 0755  WBC 9.1  NEUTROABS 7.3  HGB 12.8  HCT 38.3  MCV 80.0  PLT 369   Cardiac Enzymes:  Recent Labs Lab 05/14/13 0755  TROPONINI <0.30    BNP (last 3 results) No results found for this basename: PROBNP,  in the last 8760 hours CBG: No results found for this basename: GLUCAP,  in the last 168 hours  Radiological Exams on Admission: Ct Head Wo Contrast  05/14/2013   CLINICAL DATA:  Headache, dizziness, nausea  EXAM: CT HEAD WITHOUT CONTRAST  TECHNIQUE: Contiguous axial images were obtained from the base of the skull through the vertex without intravenous contrast.  COMPARISON:  11/08/2008.  FINDINGS: No skull fracture is noted. Paranasal sinuses and mastoid air cells are unremarkable. No intracranial hemorrhage, mass effect or midline shift. No hydrocephalus. The gray and white-matter differentiation is preserved. No acute infarction. No mass lesion is noted on this unenhanced scan.  IMPRESSION: No acute intracranial abnormality.   Electronically Signed   By: Natasha Mead M.D.   On: 05/14/2013 12:34   Ct Angio Chest Pe W/cm &/or Wo Cm  05/14/2013   CLINICAL DATA:  Chest pain, possible PE, dizziness  EXAM: CT ANGIOGRAPHY CHEST WITH CONTRAST  TECHNIQUE: Multidetector CT imaging of the chest was performed using the standard protocol during bolus administration of intravenous contrast. Multiplanar CT image reconstructions including MIPs were obtained to evaluate the vascular anatomy.  CONTRAST:  OMNIPAQUE IOHEXOL 350 MG/ML SOLN  COMPARISON:  11/29/2012  FINDINGS: Sagittal  images of the spine shows mild degenerative changes thoracic spine. No pulmonary embolus is noted. Central airways are patent. Images of the thoracic inlet are unremarkable. No mediastinal or hilar adenopathy. No mediastinal hematoma. Central pulmonary artery is unremarkable. No destructive bony lesions are noted. No rib lesions are identified.  Visualized upper abdomen is unremarkable.  Images of the lung parenchyma shows no acute infiltrate or pulmonary edema. No pneumothorax. No pulmonary nodules.  Review of the MIP images confirms the above findings.  IMPRESSION: 1. No pulmonary embolus.  Mild degenerative changes thoracic spine. 2. No mediastinal hematoma or adenopathy. 3. No acute infiltrate or pulmonary edema.   Electronically Signed   By: Natasha Mead M.D.   On: 05/14/2013 10:00   Mr Brain Wo Contrast  05/14/2013   CLINICAL DATA:  37 year old female with dizziness, nausea vomiting, difficulty walking x2 days. Initial encounter.  EXAM: MRI HEAD WITHOUT CONTRAST  TECHNIQUE: Multiplanar, multiecho pulse sequences of the brain and surrounding structures were obtained without intravenous contrast.  COMPARISON:  Head CTs 05/14/2013 and earlier.  FINDINGS: Cerebral volume is normal. No restricted diffusion to suggest acute infarction. No midline shift, mass effect, evidence of mass lesion, ventriculomegaly, extra-axial collection  or acute intracranial hemorrhage. Cervicomedullary junction and pituitary are within normal limits. Negative visualized cervical spine. Intermittent motion artifact. Major intracranial vascular flow voids are preserved. Wallace Cullens and white matter signal is within normal limits throughout the brain.  Internal auditory structures are motion degraded, grossly normal. Mastoids are clear.  Paranasal sinuses are clear. Grossly normal orbits soft tissues. Normal bone marrow signal. Negative scalp soft tissues.  IMPRESSION: Negative noncontrast brain MRI, intermittently degraded by motion artifact.    Electronically Signed   By: Augusto Gamble M.D.   On: 05/14/2013 14:39    EKG: Independently reviewed. Sinus rhythm with no ST elevations or depressions  Assessment/Plan Active Problems:   Vertigo Chest discomfort    1. Vertigo - Etiology uncertain but at this point not a central cause. Most likely a peripheral cause given the negative CT and MRI head. - Will have physical therapy evaluate patient and make further recommendations - Also at this point we'll consult neurology to see if etiology is related to any other neurological cause, if cleared by neurology patient will require ENT evaluation which can be done as an outpatient. - Will also obtain orthostatic vital signs  2. Chest discomfort - Telemetry monitoring - Given age cardiac origin as cause unlikely but given obesity and smoking history will cycle cardiac enzymes - CT of chest negative for PE, infiltrate or pulmonary edema - Could be related to anxiety given the patient reports she has had this before with panic attacks  3. nicotine dependence -Recommended tobacco cessation - Place order for nicotine patch  4. DVT prophylaxis Heparin subcutaneous  Code Status: full Family Communication: Discussed with patient and significant other at bedside. Disposition Plan: Will have physical therapy evaluate patient and make recommendations. If cleared by neurologist we'll plan on discharging with physical therapy recommendations with ENT referral for vestibulocochlear testing.  Time spent: > 60 minutes  Penny Pia [facility] Pager [phone]

## 2013-05-14 NOTE — Progress Notes (Addendum)
Attempted to call ED RN for report.  Placed on hold for 5 minutes.  Attempted to call ED again at 18:20 with no answer.

## 2013-05-14 NOTE — ED Notes (Signed)
Pt c/o of sudden onset of dizziness, n/d when getting dressed for work this am. States that she doesn't know if its her nerves. Denies chest pain.

## 2013-05-15 LAB — TROPONIN I: Troponin I: <0.3 ng/mL (ref <0.30)

## 2013-05-15 LAB — CBC
HCT: 34.7 % — ABNORMAL LOW (ref 36.0–46.0)
Hemoglobin: 11.5 g/dL — ABNORMAL LOW (ref 12.0–15.0)
MCH: 26.4 pg (ref 26.0–34.0)
MCHC: 33.1 g/dL (ref 30.0–36.0)
MCV: 79.6 fL (ref 78.0–100.0)
RBC: 4.36 MIL/uL (ref 3.87–5.11)
RDW: 15.1 % (ref 11.5–15.5)

## 2013-05-15 LAB — BASIC METABOLIC PANEL
BUN: 14 mg/dL (ref 6–23)
CO2: 22 mEq/L (ref 19–32)
Chloride: 104 mEq/L (ref 96–112)
Creatinine, Ser: 0.6 mg/dL (ref 0.50–1.10)
GFR calc Af Amer: >90 mL/min (ref >90)
Glucose, Bld: 103 mg/dL — ABNORMAL HIGH (ref 70–99)
Potassium: 3.6 mEq/L (ref 3.5–5.1)

## 2013-05-15 MED ORDER — BUTALBITAL-APAP-CAFFEINE 50-325-40 MG PO TABS: 1.0000 | ORAL_TABLET | ORAL | Status: DC | PRN

## 2013-05-15 MED ORDER — BUTALBITAL-APAP-CAFFEINE 50-325-40 MG PO TABS: 1.0000 | ORAL_TABLET | ORAL | Status: DC

## 2013-05-15 MED ORDER — MECLIZINE HCL 25 MG PO TABS
25.0000 mg | ORAL_TABLET | ORAL | Status: AC
  Administered 2013-05-15: 25 mg via ORAL
  Filled 2013-05-15: qty 1

## 2013-05-15 MED ORDER — BUTALBITAL-APAP-CAFFEINE 50-325-40 MG PO TABS
1.0000 | ORAL_TABLET | ORAL | Status: DC | PRN
  Administered 2013-05-15: 2 via ORAL
  Filled 2013-05-15: qty 2

## 2013-05-15 MED ORDER — ONDANSETRON HCL 4 MG PO TABS: 4.0000 mg | ORAL_TABLET | Freq: Four times a day (QID) | ORAL | Status: DC | PRN

## 2013-05-15 MED ORDER — TRAMADOL HCL 50 MG PO TABS: 50.0000 mg | ORAL_TABLET | Freq: Four times a day (QID) | ORAL | Status: DC | PRN

## 2013-05-15 NOTE — Progress Notes (Signed)
RN called me that pt was confused about whether she need physical therapy or not. I called the patient in the room and have explained to her that since occupational therapy has recommended outpatient PT than she did not have to wait for physical therapy evaluation. She then mentioned she was still dizzy and that we have not done proper work up for this and if she were to go home and falls she can can sue the hospital for that. I explained to her that her CT and MRI did not reveal acute findings (which I discussed with her earlier this morning). I also explained to her that sometimes we may not find what is wrong while patient is in the hospital and that is when we defer the further evaluation to PCP. She was very insulting towards me telling me that I did not know what I was doing and am " prettty much kicking her out". I explained to her I will place stat order for meclizine to try again even though she said when she was given this medication she did not tolerated it and she vomited. I also told her will see what PT recommends once they see her prior to discharge.   Manson Passey [facility] [phone]

## 2013-05-15 NOTE — Discharge Summary (Signed)
Physician Discharge Summary  Jillian Baker KGM:[id number] DOB: Feb 11, 1976 DOA: 05/14/2013  PCP: Provider Not In System  Admit date: 05/14/2013 Discharge date: 05/15/2013  Recommendations for Outpatient Follow-up:  1. please followup with primary care physician in one to 2 weeks after discharge to make sure that your symptoms are controlled.  Discharge Diagnoses:  Principal Problem:   Vertigo Active Problems:   Chest discomfort   Nicotine dependence    Discharge Condition: Medically stable for discharge home today  Diet recommendation: As tolerated  History of present illness:  37 year old female with past medical history of active smoking and obesity who presented to [facility] ED 05/14/2013 with worsening headaches associated with vertigo and some chest discomfort. This started on the day of the admission. There was no aggravating or alleviating factors. Workup in the hospital included CT head and MRI which did not reveal acute findings. We gave patient Fioricet which helped control the pain. In addition CT anterior chest was negative for pulmonary embolus   Hospital Course:   Principal Problem:   Migraine headache - Started fioricet which helped with headaches. No allergic reaction reported  Active Problems:   Chest discomfort - PE ruled out with negative CT chest angio - No chest pain at this time   Signed:  Manson Passey, MD  [facility] 05/15/2013, 11:55 AM  Pager #: [phone]  Procedures:  None   Consultations:  Neuro   Discharge Exam: Filed Vitals:   05/15/13 0338  BP: 100/68  Pulse: 73  Temp: 98.5 F (36.9 C)  Resp: 18   Filed Vitals:   05/14/13 1840 05/14/13 2000 05/14/13 2025 05/15/13 0338  BP: 106/62  111/54 100/68  Pulse: 94  97 73  Temp: 98.4 F (36.9 C)  98.3 F (36.8 C) 98.5 F (36.9 C)  TempSrc: Oral  Oral Oral  Resp: 18 18 20 18   Height: 5' (1.524 m)     Weight: 86.5 kg (190 lb 11.2 oz)     SpO2: 100% 100% 100% 99%     General: Pt is alert, follows commands appropriately, not in acute distress Cardiovascular: Regular rate and rhythm, S1/S2 +, no murmurs, no rubs, no gallops Respiratory: Clear to auscultation bilaterally, no wheezing, no crackles, no rhonchi Abdominal: Soft, non tender, non distended, bowel sounds +, no guarding Extremities: no edema, no cyanosis, pulses palpable bilaterally DP and PT Neuro: Grossly nonfocal  Discharge Instructions  Discharge Orders   Future Orders Complete By Expires   Call MD for:  difficulty breathing, headache or visual disturbances  As directed    Call MD for:  persistant dizziness or light-headedness  As directed    Call MD for:  persistant nausea and vomiting  As directed    Call MD for:  severe uncontrolled pain  As directed    Diet - low sodium heart healthy  As directed    Discharge instructions  As directed    Comments:     1. please followup with primary care physician in one to 2 weeks after discharge to make sure that your symptoms are controlled.   Increase activity slowly  As directed        Medication List         butalbital-acetaminophen-caffeine 50-325-40 MG per tablet  Commonly known as:  FIORICET, ESGIC  Take 1-2 tablets by mouth every 4 (four) hours as needed for headache or migraine.     ondansetron 4 MG tablet  Commonly known as:  ZOFRAN  Take 1 tablet (4 mg  total) by mouth every 6 (six) hours as needed for nausea or vomiting.     traMADol 50 MG tablet  Commonly known as:  ULTRAM  Take 1 tablet (50 mg total) by mouth every 6 (six) hours as needed for moderate pain.           Follow-up Information   Follow up with Provider Not In System.       The results of significant diagnostics from this hospitalization (including imaging, microbiology, ancillary and laboratory) are listed below for reference.    Significant Diagnostic Studies: Ct Head Wo Contrast 05/14/2013   IMPRESSION: No acute intracranial abnormality.   Ct  Angio Chest Pe W/cm &/or Wo Cm 05/14/2013     IMPRESSION: 1. No pulmonary embolus.  Mild degenerative changes thoracic spine. 2. No mediastinal hematoma or adenopathy. 3. No acute infiltrate or pulmonary edema.     Mr Brain Wo Contrast 05/14/2013    IMPRESSION: Negative noncontrast brain MRI, intermittently degraded by motion artifact.      Microbiology: No results found for this or any previous visit (from the past 240 hour(s)).   Labs: Basic Metabolic Panel:  Recent Labs Lab 05/14/13 0755 05/14/13 1907 05/15/13 0620  NA 137  --  138  K 3.9  --  3.6  CL 99  --  104  CO2 25  --  22  GLUCOSE 103*  --  103*  BUN 13  --  14  CREATININE 0.68 0.55 0.60  CALCIUM 9.2  --  8.5   Liver Function Tests:  Recent Labs Lab 05/14/13 0755  AST 16  ALT 13  ALKPHOS 83  BILITOT 0.2*  PROT 7.7  ALBUMIN 3.5    Recent Labs Lab 05/14/13 0755  LIPASE 16   No results found for this basename: AMMONIA,  in the last 168 hours CBC:  Recent Labs Lab 05/14/13 0755 05/14/13 1907 05/15/13 0620  WBC 9.1 9.2 8.1  NEUTROABS 7.3  --   --   HGB 12.8 12.2 11.5*  HCT 38.3 37.1 34.7*  MCV 80.0 78.9 79.6  PLT 369 391 370   Cardiac Enzymes:  Recent Labs Lab 05/14/13 0755 05/14/13 1907 05/15/13 0110 05/15/13 0620  TROPONINI <0.30 <0.30 <0.30 <0.30   BNP: BNP (last 3 results) No results found for this basename: PROBNP,  in the last 8760 hours CBG: No results found for this basename: GLUCAP,  in the last 168 hours  Time coordinating discharge: Over 30 minutes

## 2013-05-15 NOTE — Progress Notes (Signed)
NEURO HOSPITALIST PROGRESS NOTE   SUBJECTIVE:                                                                                                                        Patient continues to have sensation to drifting to the right. She does feel slightly better.  Today she noted a posterior head/neck discomfort that was present yesterday.  No pain with movement.  She has a history of HA in the past.    OBJECTIVE:                                                                                                                           Vital signs in last 24 hours: Temp:  [98.2 F (36.8 C)-98.5 F (36.9 C)] 98.5 F (36.9 C) (12/16 0338) Pulse Rate:  [73-97] 73 (12/16 0338) Resp:  [16-20] 18 (12/16 0338) BP: (100-117)/(54-76) 100/68 mmHg (12/16 0338) SpO2:  [99 %-100 %] 99 % (12/16 0338) Weight:  [86.5 kg (190 lb 11.2 oz)] 86.5 kg (190 lb 11.2 oz) (12/15 1840)  Intake/Output from previous day: 12/15 0701 - 12/16 0700 In: 870 [P.O.:870] Out: -  Intake/Output this shift:   Nutritional status: General  History reviewed. No pertinent past medical history.   Neurologic Exam:  Mental Status: Alert, oriented, thought content appropriate.  Speech fluent without evidence of aphasia.  Able to follow 3 step commands without difficulty. Cranial Nerves: II: Discs flat bilaterally; Visual fields grossly normal, pupils equal, round, reactive to light and accommodation III,IV, VI: ptosis not present, extra-ocular motions intact bilaterally V,VII: smile symmetric, facial light touch sensation normal bilaterally VIII: hearing normal bilaterally IX,X: gag reflex present XI: bilateral shoulder shrug XII: midline tongue extension without atrophy or fasciculations  Motor: Right : Upper extremity   5/5    Left:     Upper extremity   5/5  Lower extremity   5/5     Lower extremity   5/5 Tone and bulk:normal tone throughout; no atrophy noted Sensory: Pinprick and  light touch intact throughout, bilaterally Deep Tendon Reflexes:  Right: Upper Extremity   Left: Upper extremity   biceps (C-5 to C-6) 2/4   biceps (C-5 to C-6) 2/4 tricep (C7) 2/4    triceps (C7)  2/4 Brachioradialis (C6) 2/4  Brachioradialis (C6) 2/4  Lower Extremity Lower Extremity  quadriceps (L-2 to L-4) 2/4   quadriceps (L-2 to L-4) 2/4 Achilles (S1) 2/4   Achilles (S1) 2/4  Plantars: Right: downgoing   Left: downgoing Cerebellar: normal finger-to-nose,  normal heel-to-shin test Gait: still drifting tot he right when she walks but not as pronounced.  Able to stand with both feet together without difficulty.  CV: pulses palpable throughout    Lab Results: No components found with this basename: cbc,  bmp,  coags,  chol,  tri,  ldl,  hga1c   Lipid Panel No results found for this basename: CHOL, TRIG, HDL, CHOLHDL, VLDL, LDLCALC,  in the last 72 hours  Studies/Results: Ct Head Wo Contrast  05/14/2013   CLINICAL DATA:  Headache, dizziness, nausea  EXAM: CT HEAD WITHOUT CONTRAST  TECHNIQUE: Contiguous axial images were obtained from the base of the skull through the vertex without intravenous contrast.  COMPARISON:  11/08/2008.  FINDINGS: No skull fracture is noted. Paranasal sinuses and mastoid air cells are unremarkable. No intracranial hemorrhage, mass effect or midline shift. No hydrocephalus. The gray and white-matter differentiation is preserved. No acute infarction. No mass lesion is noted on this unenhanced scan.  IMPRESSION: No acute intracranial abnormality.   Electronically Signed   By: Natasha Mead M.D.   On: 05/14/2013 12:34   Ct Angio Chest Pe W/cm &/or Wo Cm  05/14/2013   CLINICAL DATA:  Chest pain, possible PE, dizziness  EXAM: CT ANGIOGRAPHY CHEST WITH CONTRAST  TECHNIQUE: Multidetector CT imaging of the chest was performed using the standard protocol during bolus administration of intravenous contrast. Multiplanar CT image reconstructions including MIPs were obtained  to evaluate the vascular anatomy.  CONTRAST:  OMNIPAQUE IOHEXOL 350 MG/ML SOLN  COMPARISON:  11/29/2012  FINDINGS: Sagittal images of the spine shows mild degenerative changes thoracic spine. No pulmonary embolus is noted. Central airways are patent. Images of the thoracic inlet are unremarkable. No mediastinal or hilar adenopathy. No mediastinal hematoma. Central pulmonary artery is unremarkable. No destructive bony lesions are noted. No rib lesions are identified.  Visualized upper abdomen is unremarkable.  Images of the lung parenchyma shows no acute infiltrate or pulmonary edema. No pneumothorax. No pulmonary nodules.  Review of the MIP images confirms the above findings.  IMPRESSION: 1. No pulmonary embolus.  Mild degenerative changes thoracic spine. 2. No mediastinal hematoma or adenopathy. 3. No acute infiltrate or pulmonary edema.   Electronically Signed   By: Natasha Mead M.D.   On: 05/14/2013 10:00   Mr Brain Wo Contrast  05/14/2013   CLINICAL DATA:  37 year old female with dizziness, nausea vomiting, difficulty walking x2 days. Initial encounter.  EXAM: MRI HEAD WITHOUT CONTRAST  TECHNIQUE: Multiplanar, multiecho pulse sequences of the brain and surrounding structures were obtained without intravenous contrast.  COMPARISON:  Head CTs 05/14/2013 and earlier.  FINDINGS: Cerebral volume is normal. No restricted diffusion to suggest acute infarction. No midline shift, mass effect, evidence of mass lesion, ventriculomegaly, extra-axial collection or acute intracranial hemorrhage. Cervicomedullary junction and pituitary are within normal limits. Negative visualized cervical spine. Intermittent motion artifact. Major intracranial vascular flow voids are preserved. Wallace Cullens and white matter signal is within normal limits throughout the brain.  Internal auditory structures are motion degraded, grossly normal. Mastoids are clear.  Paranasal sinuses are clear. Grossly normal orbits soft tissues. Normal bone  marrow signal. Negative scalp soft tissues.  IMPRESSION: Negative noncontrast brain MRI, intermittently degraded by motion artifact.  Electronically Signed   By: Augusto Gamble M.D.   On: 05/14/2013 14:39    MEDICATIONS                                                                                                                        Scheduled: . diazepam  1 mg Oral Q6H  . heparin  5,000 Units Subcutaneous Q8H  . nicotine  14 mg Transdermal Daily    ASSESSMENT/PLAN:                                                                                                             37 YO female with new onset dizziness and vertigo associated with sensation of falling to the right. MRI has been obtained and shows no acute infarct. Likely peripheral/vestibular in origin. OT have performed vestibular evaluation and recommends out patient continued vestibular evaluation and treatment.   Recommend:  1) out patient vestibular therapy 2) symptomatic treatment of HA   Neurology S/O  Assessment and plan discussed with with attending physician and they are in agreement.    Felicie Morn PA-C [facility] [phone]  05/15/2013, 10:15 AM

## 2013-05-15 NOTE — Evaluation (Signed)
Occupational Therapy Evaluation Patient Details Name: Jillian Baker MRN: [id number] DOB: 1976/01/24 Today's Date: 05/15/2013 Time: 7829-5621 OT Time Calculation (min): 35 min  OT Assessment / Plan / Recommendation History of present illness pt was admitted with dizziness (spinning) and LOB to R   Clinical Impression   Pt was seen for Vestibular eval:  She presents with R BPPV.  Performed Epley.  Will monitor and perform additional maneuvers if necessary.     OT Assessment  Patient needs continued OT Services    Follow Up Recommendations  Other (comment) (OP vestibular program)    Barriers to Discharge      Equipment Recommendations   (close supervision/seat/grab bar in shower if needed)    Recommendations for Other Services    Frequency  Min 2X/week    Precautions / Restrictions Precautions Precautions: Fall Restrictions Weight Bearing Restrictions: No   Pertinent Vitals/Pain Pt c/o headache, back of head.  Not rated.  Asked for pain meds.  Sitting BP 111/68; standing 114/72    ADL  Transfers/Ambulation Related to ADLs: min A/guard for standing and ambulation ADL Comments: Vestibular eval:  pt's eyes are turned inward slightly.  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  Pt was very guarded with movement:  could only look at [facility] with her in control of moving head:  no blurriness.  Visual fields and gaze holding were WFLs.  Pt reports most dizziness with looking upward.  Modified Hallpike to R was positive; L was negative. Pt initially nauseas after test, but did not vomit.   Performed Epley maneuver.  Pt is overall set up for UB ADLs and min A for balance for LB ADLs    OT Diagnosis: Generalized weakness  OT Problem List: Impaired balance (sitting and/or standing);Decreased knowledge of use of DME or AE OT Treatment Interventions: Self-care/ADL training;DME and/or AE instruction;Patient/family education   OT Goals(Current goals can be found in the care plan section) Acute  Rehab OT Goals Patient Stated Goal: home today:  tomorrow is daughter's b-day OT Goal Formulation: With patient Time For Goal Achievement: 05/22/13 Potential to Achieve Goals: Good ADL Goals Additional ADL Goal #1: Pt will complete ADL with supervision, sit to stand and set up Additional ADL Goal #2: Pt will ambulate to bathroom wtih supervision and no c/o dizziness  Visit Information  Last OT Received On: 05/15/13 History of Present Illness: pt was admitted with dizziness (spinning) and LOB to R       Prior Functioning     Home Living Family/patient expects to be discharged to:: Private residence Living Arrangements: Spouse/significant other Prior Function Level of Independence: Independent 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         Vision/Perception Vision - Assessment Eye Alignment: Impaired (comment) (eyes esotrophic)   Cognition  Cognition Arousal/Alertness: Awake/alert Behavior During Therapy: WFL for tasks assessed/performed Overall Cognitive Status: Within Functional Limits for tasks assessed    Extremity/Trunk Assessment Upper Extremity Assessment Upper Extremity Assessment: Overall WFL for tasks assessed     Mobility Bed Mobility Bed Mobility: Supine to Sit;Sit to Supine;Sit to Sidelying Right;Sit to Sidelying Left Supine to Sit: 6: Modified independent (Device/Increase time) Sit to Supine: 6: Modified independent (Device/Increase time) Sit to Sidelying Right: 6: Modified independent (Device/Increase time) Sit to Sidelying Left: 6: Modified independent (Device/Increase time) Details for Bed Mobility Assistance: extra time for all Transfers Transfers: Sit to Stand;Stand to Sit Sit to Stand: 4: Min assist Stand to Sit: 4: Min assist  Details for Transfer Assistance: pt initially off balance forward, at first attempt     Exercise     Balance     End of Session OT - End of Session Activity  Tolerance: Patient tolerated treatment well Patient left: in bed;with call bell/phone within reach;with family/visitor present  GO Functional Assessment Tool Used: clinical observation/judgment Functional Limitation: Self care Self Care Current Status (O9629): At least 20 percent but less than 40 percent impaired, limited or restricted Self Care Goal Status (B2841): At least 1 percent but less than 20 percent impaired, limited or restricted   [facility], [place] 05/15/2013, 8:58 AM Marica Otter, OTR/L [phone] 05/15/2013

## 2013-05-15 NOTE — Discharge Instructions (Signed)
Migraine Headache A migraine headache is an intense, throbbing pain on one or both sides of your head. A migraine can last for 30 minutes to several hours. CAUSES  The exact cause of a migraine headache is not always known. However, a migraine may be caused when nerves in the brain become irritated and release chemicals that cause inflammation. This causes pain. SYMPTOMS  Pain on one or both sides of your head.  Pulsating or throbbing pain.  Severe pain that prevents daily activities.  Pain that is aggravated by any physical activity.  Nausea, vomiting, or both.  Dizziness.  Pain with exposure to bright lights, loud noises, or activity.  General sensitivity to bright lights, loud noises, or smells. Before you get a migraine, you may get warning signs that a migraine is coming (aura). An aura may include:  Seeing flashing lights.  Seeing bright spots, halos, or zig-zag lines.  Having tunnel vision or blurred vision.  Having feelings of numbness or tingling.  Having trouble talking.  Having muscle weakness. MIGRAINE TRIGGERS  Alcohol.  Smoking.  Stress.  Menstruation.  Aged cheeses.  Foods or drinks that contain nitrates, glutamate, aspartame, or tyramine.  Lack of sleep.  Chocolate.  Caffeine.  Hunger.  Physical exertion.  Fatigue.  Medicines used to treat chest pain (nitroglycerine), birth control pills, estrogen, and some blood pressure medicines. DIAGNOSIS  A migraine headache is often diagnosed based on:  Symptoms.  Physical examination.  A CT scan or MRI of your head. TREATMENT Medicines may be given for pain and nausea. Medicines can also be given to help prevent recurrent migraines.  HOME CARE INSTRUCTIONS  Only take over-the-counter or prescription medicines for pain or discomfort as directed by your caregiver. The use of long-term narcotics is not recommended.  Lie down in a dark, quiet room when you have a migraine.  Keep a journal  to find out what may trigger your migraine headaches. For example, write down:  What you eat and drink.  How much sleep you get.  Any change to your diet or medicines.  Limit alcohol consumption.  Quit smoking if you smoke.  Get 7 to 9 hours of sleep, or as recommended by your caregiver.  Limit stress.  Keep lights dim if bright lights bother you and make your migraines worse. SEEK IMMEDIATE MEDICAL CARE IF:   Your migraine becomes severe.  You have a fever.  You have a stiff neck.  You have vision loss.  You have muscular weakness or loss of muscle control.  You start losing your balance or have trouble walking.  You feel faint or pass out.  You have severe symptoms that are different from your first symptoms. MAKE SURE YOU:   Understand these instructions.  Will watch your condition.  Will get help right away if you are not doing well or get worse. Document Released: 05/17/2005 Document Revised: 08/09/2011 Document Reviewed: 05/07/2011 [employer] Patient Information 2014 [employer].  

## 2013-05-15 NOTE — Evaluation (Signed)
Physical Therapy Evaluation Patient Details Name: Jillian Baker MRN: [id number] DOB: 1975/12/11 Today's Date: 05/15/2013 Time: 4098-1191 PT Time Calculation (min): 51 min 1331 - 1344 then nsg admin needed to speak with family and pt 1404 - 1422  PT Assessment / Plan / Recommendation History of Present Illness  pt was admitted with dizziness (spinning) and LOB to R  Clinical Impression  Pt admitted with above. Pt currently with functional limitations due to the deficits listed below (see PT Problem List).  Pt will benefit from skilled PT to increase their independence and safety with mobility to allow discharge to the venue listed below.  Pt continues to report dizziness/spinning 10/10 with ambulation on evaluation.  Pt educated on use of RW to assist with steadying during gait.  Retested R modified Gilberto Better and still positive so performed Semont maneuver with pt.  Pt educated and provided with handout on Semont maneuver.  Pt ambulated again and reported only 5/10 dizziness during ambulation after Semont maneuver.  Will check back if remains in acute.  Recommend Outpatient Vestibular PT and RW upon d/c.  Pt also reports she has 21 steps into apt so educated to have person assist on one side and use rail while stepping up one stair at a time if d/c home today.     PT Assessment  Patient needs continued PT services    Follow Up Recommendations  Outpatient PT (vestibular rehab)    Does the patient have the potential to tolerate intense rehabilitation      Barriers to Discharge        Equipment Recommendations  Rolling walker with 5" wheels    Recommendations for Other Services     Frequency Min 3X/week    Precautions / Restrictions Precautions Precautions: Fall   Pertinent Vitals/Pain Only c/o dizziness (see below)      Mobility  Bed Mobility Bed Mobility: Supine to Sit;Sit to Supine Supine to Sit: 6: Modified independent (Device/Increase time) Sit to Supine: 6:  Modified independent (Device/Increase time) Details for Bed Mobility Assistance: increased time Transfers Transfers: Stand to Sit;Sit to Stand Sit to Stand: 4: Min assist;From bed;With upper extremity assist Stand to Sit: 4: Min guard;To bed;With upper extremity assist Details for Transfer Assistance: assist to steady upon rise Ambulation/Gait Ambulation/Gait Assistance: 4: Min assist Ambulation Distance (Feet): 100 Feet Ambulation/Gait Assistance Details: ambulated 100 feet then 80 feet after retesting for BPPV, pt reports 10/10 dizziness with first ambulation and then 5/10 dizziness with second ambulation (after Semont maneuver), more steady with RW, required bil UE assist to steady Gait Pattern: Step-through pattern;Narrow base of support;Decreased stride length Gait velocity: decreased General Gait Details: cautious, guarded gait, occasional LOB with first ambulation requiring min assist, also min assist for LOB with turns during both attemps at gait    Exercises     PT Diagnosis: Difficulty walking (R BPPV)  PT Problem List: Decreased mobility;Decreased knowledge of use of DME;Other (comment) (R BPPV) PT Treatment Interventions: DME instruction;Gait training;Stair training;Functional mobility training;Therapeutic activities;Therapeutic exercise;Patient/family education;Neuromuscular re-education;Balance training     PT Goals(Current goals can be found in the care plan section) Acute Rehab PT Goals PT Goal Formulation: With patient Time For Goal Achievement: 05/22/13 Potential to Achieve Goals: Good  Visit Information  Last PT Received On: 05/15/13 Assistance Needed: +1 History of Present Illness: pt was admitted with dizziness (spinning) and LOB to R       Prior Functioning  Home Living Family/patient expects to be discharged to:: Private residence Living Arrangements:  Spouse/significant other Available Help at Discharge: Family Type of Home: Apartment Home Access:  Stairs to enter Secretary/administrator of Steps: 21 Entrance Stairs-Rails: Right Home Layout: One level Home Equipment: Cane - single point Additional Comments: pt's mother has access to [employer] Prior Function Level of Independence: Independent 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    Cognition  Cognition Arousal/Alertness: Awake/alert Behavior During Therapy: WFL for tasks assessed/performed Overall Cognitive Status: Within Functional Limits for tasks assessed    Extremity/Trunk Assessment Lower Extremity Assessment Lower Extremity Assessment: Overall WFL for tasks assessed   Balance    End of Session PT - End of Session Equipment Utilized During Treatment: Gait belt Activity Tolerance: Other (comment) (limited by dizziness) Patient left: in bed;with call bell/phone within reach;with family/visitor present  GP Functional Assessment Tool Used: clinical judgement, positive R modified dix hallpike Functional Limitation: Mobility: Walking and moving around Mobility: Walking and Moving Around Current Status [phone]): At least 20 percent but less than 40 percent impaired, limited or restricted Mobility: Walking and Moving Around Goal Status [phone]): At least 1 percent but less than 20 percent impaired, limited or restricted   Oshay Stranahan,KATHrine E 05/15/2013, 2:48 PM Zenovia Jarred, PT, DPT 05/15/2013 Pager: [phone]

## 2013-08-16 ENCOUNTER — Emergency Department (HOSPITAL_BASED_OUTPATIENT_CLINIC_OR_DEPARTMENT_OTHER)
Admission: EM | Admit: 2013-08-16 | Discharge: 2013-08-16 | Disposition: A | Discharge status: Home / Self Care | Payer: PRIVATE HEALTH INSURANCE | Attending: Emergency Medicine | Admitting: Emergency Medicine

## 2013-08-16 ENCOUNTER — Encounter (HOSPITAL_BASED_OUTPATIENT_CLINIC_OR_DEPARTMENT_OTHER): Payer: Self-pay | Admitting: Emergency Medicine

## 2013-08-16 ENCOUNTER — Emergency Department (HOSPITAL_BASED_OUTPATIENT_CLINIC_OR_DEPARTMENT_OTHER): Payer: PRIVATE HEALTH INSURANCE

## 2013-08-16 DIAGNOSIS — X500XXA Overexertion from strenuous movement or load, initial encounter: Secondary | ICD-10-CM | POA: Insufficient documentation

## 2013-08-16 DIAGNOSIS — Y9389 Activity, other specified: Secondary | ICD-10-CM | POA: Insufficient documentation

## 2013-08-16 DIAGNOSIS — S46909A Unspecified injury of unspecified muscle, fascia and tendon at shoulder and upper arm level, unspecified arm, initial encounter: Secondary | ICD-10-CM | POA: Insufficient documentation

## 2013-08-16 DIAGNOSIS — Y929 Unspecified place or not applicable: Secondary | ICD-10-CM | POA: Insufficient documentation

## 2013-08-16 DIAGNOSIS — F17200 Nicotine dependence, unspecified, uncomplicated: Secondary | ICD-10-CM | POA: Insufficient documentation

## 2013-08-16 DIAGNOSIS — S2341XA Sprain of ribs, initial encounter: Secondary | ICD-10-CM | POA: Insufficient documentation

## 2013-08-16 DIAGNOSIS — S199XXA Unspecified injury of neck, initial encounter: Secondary | ICD-10-CM

## 2013-08-16 DIAGNOSIS — S4980XA Other specified injuries of shoulder and upper arm, unspecified arm, initial encounter: Secondary | ICD-10-CM | POA: Insufficient documentation

## 2013-08-16 DIAGNOSIS — S29011A Strain of muscle and tendon of front wall of thorax, initial encounter: Secondary | ICD-10-CM

## 2013-08-16 DIAGNOSIS — S0993XA Unspecified injury of face, initial encounter: Secondary | ICD-10-CM | POA: Insufficient documentation

## 2013-08-16 MED ORDER — IBUPROFEN 800 MG PO TABS: 800.0000 mg | ORAL_TABLET | Freq: Three times a day (TID) | ORAL | Status: DC

## 2013-08-16 MED ORDER — CYCLOBENZAPRINE HCL 10 MG PO TABS: 10.0000 mg | ORAL_TABLET | Freq: Two times a day (BID) | ORAL | Status: DC | PRN

## 2013-08-16 NOTE — ED Notes (Signed)
Lifts heavy luggage at work daily. Pt states noticed a pulling sensation Friday night in neck and upper back and now starting in center of chest.

## 2013-08-16 NOTE — Discharge Instructions (Signed)
Muscle Strain A muscle strain is an injury that occurs when a muscle is stretched beyond its normal length. Usually a small number of muscle fibers are torn when this happens. Muscle strain is rated in degrees. First-degree strains have the least amount of muscle fiber tearing and pain. Second-degree and third-degree strains have increasingly more tearing and pain.  Usually, recovery from muscle strain takes 1 2 weeks. Complete healing takes 5 6 weeks.  CAUSES  Muscle strain happens when a sudden, violent force placed on a muscle stretches it too far. This may occur with lifting, sports, or a fall.  RISK FACTORS Muscle strain is especially common in athletes.  SIGNS AND SYMPTOMS At the site of the muscle strain, there may be:  Pain.  Bruising.  Swelling.  Difficulty using the muscle due to pain or lack of normal function. DIAGNOSIS  Your health care provider will perform a physical exam and ask about your medical history. TREATMENT  Often, the best treatment for a muscle strain is resting, icing, and applying cold compresses to the injured area.  HOME CARE INSTRUCTIONS   Use the PRICE method of treatment to promote muscle healing during the first 2 3 days after your injury. The PRICE method involves:  Protecting the muscle from being injured again.  Restricting your activity and resting the injured body part.  Icing your injury. To do this, put ice in a plastic bag. Place a towel between your skin and the bag. Then, apply the ice and leave it on from 15 20 minutes each hour. After the third day, switch to moist heat packs.  Apply compression to the injured area with a splint or elastic bandage. Be careful not to wrap it too tightly. This may interfere with blood circulation or increase swelling.  Elevate the injured body part above the level of your heart as often as you can.  Only take over-the-counter or prescription medicines for pain, discomfort, or fever as directed by your  health care provider.  Warming up prior to exercise helps to prevent future muscle strains. SEEK MEDICAL CARE IF:   You have increasing pain or swelling in the injured area.  You have numbness, tingling, or a significant loss of strength in the injured area. MAKE SURE YOU:   Understand these instructions.  Will watch your condition.  Will get help right away if you are not doing well or get worse. Document Released: 05/17/2005 Document Revised: 03/07/2013 Document Reviewed: 12/14/2012 [facility] Patient Information 2014 [place], [place]. Heat Therapy Heat therapy can help ease achy, tense, stiff, and tight muscles and joints. Heat should not be used on new injuries. Wait at least 48 hours after the injury before using heat therapy. Heat also should not be used for discomfort or pain that occurs right after doing an activity. If you still have pain or stiffness 3 hours after finishing the activity, then heat therapy may be used. PRECAUTIONS  High heat or prolonged exposure to heat can cause burns. Be careful when using heat therapy to avoid burning your skin. If you have any of the following conditions, do not use heat until you have discussed heat therapy with your caregiver:  Poor circulation.  Healing wounds or scarred skin in the area being treated.  Diabetes, heart disease, or high blood pressure.  Numbness of the area being treated.  Unusual swelling of the area being treated.  Active infections.  Blood clots.  Cancer.  Inability to communicate your response to pain. This can include  young children and people with dementia. HOME CARE INSTRUCTIONS Moist heat pack  Soak a clean towel in warm water, and squeeze out the extra water. The water temperature should be comfortable to the skin.  Put the warm, wet towel in a plastic bag.  Place a thin, dry towel between your skin and the bag.  Put the heat pack on the area for 5 minutes, and check your skin. Your skin may be  pink, but it should not be red.  Leave the heat pack on the area for a total of 15 to 30 minutes.  Repeat this every 2 to 4 hours while awake. Do not use heat while you are sleeping. Warm water bath  Fill a tub with warm water. The water temperature should be comfortable to the skin.  Place the affected body part in the tub.  Soak the area for 20 to 40 minutes.  Repeat as needed. Hot water bottle  Fill the water bottle half full with hot water.  Press out the extra air. Close the cap tightly.  Place a dry towel between your skin and the bottle.  Put the bottle on the area for 5 minutes, and check your skin. Your skin may be pink, but it should not be red.  Leave the bottle on the area for a total of 15 to 30 minutes.  Repeat this every 2 to 4 hours while awake. Electric heating pad  Place a dry towel between your skin and the heating pad.  Set the heating pad on low heat.  Put the heating pad on the area for 10 minutes, and check your skin. Your skin may be pink, but it should not be red.  Leave the heating pad on the area for a total of 20 to 40 minutes.  Repeat this every 2 to 4 hours while awake.  Do not lie on the heating pad.  Do not fall asleep while using the heating pad.  Do not use the heating pad near water. Contact with water can result in an electrical shock. SEEK MEDICAL CARE IF:  You have blisters, redness, swelling, or numbness.  You have any new problems.  Your problems are getting worse.  You have any questions or concerns. If you develop any problems, stop using heat therapy until you see your caregiver. MAKE SURE YOU:  Understand these instructions.  Will watch your condition.  Will get help right away if you are not doing well or get worse. Document Released: 08/09/2011 Document Reviewed: 08/09/2011 [facility] Patient Information 2014 [place].

## 2013-08-16 NOTE — ED Notes (Signed)
Patient changed into gown from waist up.

## 2013-08-16 NOTE — ED Provider Notes (Signed)
Medical screening examination/treatment/procedure(s) were performed by non-physician practitioner and as supervising physician I was immediately available for consultation/collaboration.   EKG Interpretation   Date/Time:  Thursday August 16 2013 12:03:51 EDT Ventricular Rate:  68 PR Interval:  130 QRS Duration: 92 QT Interval:  378 QTC Calculation: 401 R Axis:   -15 Text Interpretation:  Normal sinus rhythm with sinus arrhythmia Minimal  voltage criteria for LVH, may be normal variant Borderline ECG No  significant change since last tracing Confirmed by Betsey Holiday  MD,  Desirre Eickhoff [phone]) on 08/16/2013 12:07:35 PM        Orpah Greek, MD 08/16/13 1320

## 2013-08-16 NOTE — ED Notes (Signed)
Patient transported to X-ray ambulatory with tech. 

## 2013-08-16 NOTE — ED Notes (Signed)
Pt ambulatory to restroom

## 2013-08-16 NOTE — ED Provider Notes (Signed)
CSN: [id number]     Arrival date & time 08/16/13  1154 History   First MD Initiated Contact with Patient 08/16/13 1205     Chief Complaint  Patient presents with  . Neck Pain  . Shoulder Pain  . Chest Pain     (Consider location/radiation/quality/duration/timing/severity/associated sxs/prior Treatment) HPI Comments: Patient is a 38 year-old female presenting with complaints of constant chest pain, neck pain, and back pain x 6 days. She has tried ibuprofen, heating pads and warm baths, none of which relieve her symptoms. Moving and walking make the pain worse.   The chest pain and back pain are described as a heavy, sitting 7/10 pain that does not radiate. The neck pain is described as 10/10.   The history is provided by the patient. No language interpreter was used.    History reviewed. No pertinent past medical history. Past Surgical History  Procedure Laterality Date  . Tubal ligation    . Cesarean section     Family History  Problem Relation Age of Onset  . Asthma Mother   . COPD Father   . Pancreatic cancer Father   . Breast cancer Paternal Aunt   . Schizophrenia Paternal Aunt    History  Substance Use Topics  . Smoking status: Current Every Day Smoker    Types: Cigarettes  . Smokeless tobacco: Never Used  . Alcohol Use: No   OB History   Grav Para Term Preterm Abortions TAB SAB Ect Mult Living                 Review of Systems  Constitutional: Negative for fever and chills.  HENT: Negative for congestion, ear pain and sore throat.   Respiratory: Positive for shortness of breath.        She states SOB is associated with smoking and is unchanged today.  Cardiovascular: Positive for chest pain.  Gastrointestinal: Negative for abdominal pain, diarrhea, constipation and blood in stool.  Genitourinary: Negative for dysuria, hematuria, flank pain, difficulty urinating and pelvic pain.  Musculoskeletal: Positive for neck pain and neck stiffness.  Neurological:  Negative for dizziness, syncope, light-headedness and headaches.      Allergies  Codeine; Ivp dye; Percocet; and Shellfish allergy  Home Medications   Current Outpatient Rx  Name  Route  Sig  Dispense  Refill  . butalbital-acetaminophen-caffeine (FIORICET, ESGIC) 50-325-40 MG per tablet   Oral   Take 1-2 tablets by mouth every 4 (four) hours as needed for headache or migraine.   60 tablet   0   . ondansetron (ZOFRAN) 4 MG tablet   Oral   Take 1 tablet (4 mg total) by mouth every 6 (six) hours as needed for nausea or vomiting.   60 tablet   0   . traMADol (ULTRAM) 50 MG tablet   Oral   Take 1 tablet (50 mg total) by mouth every 6 (six) hours as needed for moderate pain.   60 tablet   0    BP 133/78  Pulse 72  Temp(Src) 98.5 F (36.9 C) (Oral)  Resp 20  Wt 190 lb (86.183 kg)  SpO2 100%  LMP 08/01/2013 Physical Exam  Constitutional: She is oriented to person, place, and time. She appears well-developed and well-nourished. No distress.  Neck: Muscular tenderness present. No spinous process tenderness present.    Cardiovascular: Normal rate and regular rhythm.   No murmur heard. Pulmonary/Chest: Effort normal. She has no wheezes. She has no rales. She exhibits tenderness.  Abdominal: Soft. She exhibits no distension and no mass. There is no tenderness. There is no rebound and no guarding.  Musculoskeletal: Normal range of motion.  Neurological: She is alert and oriented to person, place, and time.  Skin: Skin is warm and dry. She is not diaphoretic.  Psychiatric: She has a normal mood and affect.    ED Course  Procedures (including critical care time) Labs Review Labs Reviewed - No data to display Imaging Review No results found.   EKG Interpretation   Date/Time:  Thursday August 16 2013 12:03:51 EDT Ventricular Rate:  68 PR Interval:  130 QRS Duration: 92 QT Interval:  378 QTC Calculation: 401 R Axis:   -15 Text Interpretation:  Normal sinus  rhythm with sinus arrhythmia Minimal  voltage criteria for LVH, may be normal variant Borderline ECG No  significant change since last tracing Confirmed by POLLINA  MD,  [place] ([id number]) on 08/16/2013 12:07:35 PM      MDM   Final diagnoses:  None    1. Muscle strain 2. Back/neck pain 3. Chest wall pain  Uncomplicated muscular pain of upper body, associated with movement. No concerning symptoms to suggest PE, ACS, PNA. Neg chest x-ray, unchanged EKG. Patient is well appearing.  Dewaine Oats, PA-C 08/16/13 1316

## 2015-03-07 ENCOUNTER — Emergency Department (HOSPITAL_BASED_OUTPATIENT_CLINIC_OR_DEPARTMENT_OTHER): Payer: 59

## 2015-03-07 ENCOUNTER — Emergency Department (HOSPITAL_BASED_OUTPATIENT_CLINIC_OR_DEPARTMENT_OTHER)
Admission: EM | Admit: 2015-03-07 | Discharge: 2015-03-07 | Disposition: A | Discharge status: Home / Self Care | Payer: 59 | Attending: Emergency Medicine | Admitting: Emergency Medicine

## 2015-03-07 ENCOUNTER — Encounter (HOSPITAL_BASED_OUTPATIENT_CLINIC_OR_DEPARTMENT_OTHER): Payer: Self-pay | Admitting: *Deleted

## 2015-03-07 DIAGNOSIS — R630 Anorexia: Secondary | ICD-10-CM | POA: Insufficient documentation

## 2015-03-07 DIAGNOSIS — Z720 Tobacco use: Secondary | ICD-10-CM | POA: Diagnosis not present

## 2015-03-07 DIAGNOSIS — Z3202 Encounter for pregnancy test, result negative: Secondary | ICD-10-CM | POA: Diagnosis not present

## 2015-03-07 DIAGNOSIS — R1031 Right lower quadrant pain: Secondary | ICD-10-CM | POA: Diagnosis present

## 2015-03-07 DIAGNOSIS — Z791 Long term (current) use of non-steroidal anti-inflammatories (NSAID): Secondary | ICD-10-CM | POA: Insufficient documentation

## 2015-03-07 DIAGNOSIS — D259 Leiomyoma of uterus, unspecified: Secondary | ICD-10-CM | POA: Insufficient documentation

## 2015-03-07 DIAGNOSIS — N83201 Unspecified ovarian cyst, right side: Secondary | ICD-10-CM | POA: Diagnosis not present

## 2015-03-07 LAB — CBC WITH DIFFERENTIAL/PLATELET
BASOS ABS: 0 10*3/uL (ref 0.0–0.1)
BASOS PCT: 0 %
EOS ABS: 0.2 10*3/uL (ref 0.0–0.7)
Eosinophils Relative: 2 %
HCT: 35 % — ABNORMAL LOW (ref 36.0–46.0)
Hemoglobin: 11.1 g/dL — ABNORMAL LOW (ref 12.0–15.0)
Lymphocytes Relative: 20 %
Lymphs Abs: 1.8 10*3/uL (ref 0.7–4.0)
MCH: 23.9 pg — ABNORMAL LOW (ref 26.0–34.0)
MCHC: 31.7 g/dL (ref 30.0–36.0)
MCV: 75.4 fL — ABNORMAL LOW (ref 78.0–100.0)
MONO ABS: 0.5 10*3/uL (ref 0.1–1.0)
MONOS PCT: 6 %
NEUTROS PCT: 72 %
Neutro Abs: 6.5 10*3/uL (ref 1.7–7.7)
Platelets: 421 10*3/uL — ABNORMAL HIGH (ref 150–400)
RBC: 4.64 MIL/uL (ref 3.87–5.11)
RDW: 16.6 % — AB (ref 11.5–15.5)
WBC: 9 10*3/uL (ref 4.0–10.5)

## 2015-03-07 LAB — URINALYSIS, ROUTINE W REFLEX MICROSCOPIC
BILIRUBIN URINE: NEGATIVE
Glucose, UA: NEGATIVE mg/dL
Hgb urine dipstick: NEGATIVE
Ketones, ur: NEGATIVE mg/dL
Leukocytes, UA: NEGATIVE
NITRITE: NEGATIVE
Protein, ur: NEGATIVE mg/dL
Specific Gravity, Urine: 1.02 (ref 1.005–1.030)
UROBILINOGEN UA: 0.2 mg/dL (ref 0.0–1.0)
pH: 7.5 (ref 5.0–8.0)

## 2015-03-07 LAB — COMPREHENSIVE METABOLIC PANEL
ALT: 17 U/L (ref 14–54)
ANION GAP: 5 (ref 5–15)
AST: 18 U/L (ref 15–41)
Albumin: 3.5 g/dL (ref 3.5–5.0)
Alkaline Phosphatase: 76 U/L (ref 38–126)
BUN: 13 mg/dL (ref 6–20)
CALCIUM: 8.6 mg/dL — AB (ref 8.9–10.3)
CO2: 27 mmol/L (ref 22–32)
Chloride: 106 mmol/L (ref 101–111)
Creatinine, Ser: 0.56 mg/dL (ref 0.44–1.00)
GFR calc Af Amer: >60 mL/min (ref >60)
GFR calc non Af Amer: >60 mL/min (ref >60)
Glucose, Bld: 111 mg/dL — ABNORMAL HIGH (ref 65–99)
Potassium: 3.4 mmol/L — ABNORMAL LOW (ref 3.5–5.1)
SODIUM: 138 mmol/L (ref 135–145)
TOTAL PROTEIN: 7.2 g/dL (ref 6.5–8.1)
Total Bilirubin: 0.2 mg/dL — ABNORMAL LOW (ref 0.3–1.2)

## 2015-03-07 LAB — URINE MICROSCOPIC-ADD ON

## 2015-03-07 LAB — PREGNANCY, URINE: Preg Test, Ur: NEGATIVE

## 2015-03-07 MED ORDER — KETOROLAC TROMETHAMINE 30 MG/ML IJ SOLN
30.0000 mg | Freq: Once | INTRAMUSCULAR | Status: AC
  Administered 2015-03-07: 30 mg via INTRAVENOUS
  Filled 2015-03-07: qty 1

## 2015-03-07 MED ORDER — SODIUM CHLORIDE 0.9 % IV BOLUS (SEPSIS)
1000.0000 mL | Freq: Once | INTRAVENOUS | Status: AC
  Administered 2015-03-07: 1000 mL via INTRAVENOUS

## 2015-03-07 MED ORDER — IOHEXOL 300 MG/ML  SOLN
100.0000 mL | Freq: Once | INTRAMUSCULAR | Status: AC | PRN
  Administered 2015-03-07: 100 mL via INTRAVENOUS

## 2015-03-07 MED ORDER — IOHEXOL 300 MG/ML  SOLN
50.0000 mL | Freq: Once | INTRAMUSCULAR | Status: AC | PRN
  Administered 2015-03-07: 50 mL via ORAL

## 2015-03-07 MED ORDER — DIPHENHYDRAMINE HCL 50 MG/ML IJ SOLN
12.5000 mg | Freq: Once | INTRAMUSCULAR | Status: AC
  Administered 2015-03-07: 12.5 mg via INTRAVENOUS
  Filled 2015-03-07: qty 1

## 2015-03-07 MED ORDER — MORPHINE SULFATE (PF) 4 MG/ML IV SOLN
4.0000 mg | Freq: Once | INTRAVENOUS | Status: AC
  Administered 2015-03-07: 4 mg via INTRAVENOUS
  Filled 2015-03-07: qty 1

## 2015-03-07 MED ORDER — METHYLPREDNISOLONE SODIUM SUCC 125 MG IJ SOLR
125.0000 mg | Freq: Once | INTRAMUSCULAR | Status: AC
  Administered 2015-03-07: 125 mg via INTRAVENOUS
  Filled 2015-03-07: qty 2

## 2015-03-07 MED ORDER — HYDROCODONE-ACETAMINOPHEN 5-325 MG PO TABS: 1.0000 | ORAL_TABLET | ORAL | Status: DC | PRN

## 2015-03-07 MED ORDER — ONDANSETRON HCL 4 MG/2ML IJ SOLN
4.0000 mg | Freq: Once | INTRAMUSCULAR | Status: AC
  Administered 2015-03-07: 4 mg via INTRAVENOUS
  Filled 2015-03-07: qty 2

## 2015-03-07 NOTE — Discharge Instructions (Signed)

## 2015-03-07 NOTE — ED Provider Notes (Addendum)
CSN: [id number]     Arrival date & time 03/07/15  1648 History   First MD Initiated Contact with Patient 03/07/15 1702     Chief Complaint  Patient presents with  . Abdominal Pain     (Consider location/radiation/quality/duration/timing/severity/associated sxs/prior Treatment) Patient is a 39 y.o. female presenting with abdominal pain. The history is provided by the patient.  Abdominal Pain Pain location:  RLQ Pain quality: aching, cramping, gnawing and sharp   Pain radiates to:  Does not radiate Pain severity:  Severe Onset quality:  Gradual Duration:  1 week Timing:  Constant Progression:  Worsening Chronicity:  New Context: not diet changes, not recent sexual activity, not suspicious food intake and not trauma   Relieved by:  Nothing Worsened by:  Movement and eating Ineffective treatments:  NSAIDs Associated symptoms: anorexia, nausea and vomiting   Associated symptoms: no chest pain, no cough, no dysuria, no fever, no vaginal bleeding and no vaginal discharge   Associated symptoms comment:  Occasional loose stool Risk factors: NSAID use   Risk factors: has not had multiple surgeries and not pregnant     History reviewed. No pertinent past medical history. Past Surgical History  Procedure Laterality Date  . Tubal ligation    . Cesarean section     Family History  Problem Relation Age of Onset  . Asthma Mother   . COPD Father   . Pancreatic cancer Father   . Breast cancer Paternal Aunt   . Schizophrenia Paternal Aunt    Social History  Substance Use Topics  . Smoking status: Current Every Day Smoker -- 0.50 packs/day    Types: Cigarettes  . Smokeless tobacco: Never Used  . Alcohol Use: No   OB History    No data available     Review of Systems  Constitutional: Negative for fever.  Respiratory: Negative for cough.   Cardiovascular: Negative for chest pain.  Gastrointestinal: Positive for nausea, vomiting, abdominal pain and anorexia.  Genitourinary:  Negative for dysuria, vaginal bleeding and vaginal discharge.  All other systems reviewed and are negative.     Allergies  Codeine; Ivp dye; Percocet; and Shellfish allergy  Home Medications   Prior to Admission medications   Medication Sig Start Date End Date Taking? Authorizing Provider  butalbital-acetaminophen-caffeine (FIORICET, ESGIC) 50-325-40 MG per tablet Take 1-2 tablets by mouth every 4 (four) hours as needed for headache or migraine. 05/15/13   Robbie Lis, MD  cyclobenzaprine (FLEXERIL) 10 MG tablet Take 1 tablet (10 mg total) by mouth 2 (two) times daily as needed for muscle spasms. 08/16/13   Charlann Lange, PA-C  ibuprofen (ADVIL,MOTRIN) 800 MG tablet Take 1 tablet (800 mg total) by mouth 3 (three) times daily. 08/16/13   Charlann Lange, PA-C  ondansetron (ZOFRAN) 4 MG tablet Take 1 tablet (4 mg total) by mouth every 6 (six) hours as needed for nausea or vomiting. 05/15/13   Robbie Lis, MD  traMADol (ULTRAM) 50 MG tablet Take 1 tablet (50 mg total) by mouth every 6 (six) hours as needed for moderate pain. 05/15/13   Robbie Lis, MD   BP 118/81 mmHg  Pulse 105  Temp(Src) 98.3 F (36.8 C) (Oral)  Resp 20  Ht 5' (1.524 m)  Wt 212 lb (96.163 kg)  BMI 41.40 kg/m2  SpO2 100%  LMP 02/16/2015 Physical Exam  Constitutional: She is oriented to person, place, and time. She appears well-developed and well-nourished. No distress.  HENT:  Head: Normocephalic and atraumatic.  Mouth/Throat:  Oropharynx is clear and moist.  Eyes: Conjunctivae and EOM are normal. Pupils are equal, round, and reactive to light.  Neck: Normal range of motion. Neck supple.  Cardiovascular: Normal rate, regular rhythm and intact distal pulses.   No murmur heard. Pulmonary/Chest: Effort normal and breath sounds normal. No respiratory distress. She has no wheezes. She has no rales.  Abdominal: Soft. Normal appearance. She exhibits no distension. There is tenderness in the right lower quadrant. There  is rebound and guarding. There is no CVA tenderness.  Musculoskeletal: Normal range of motion. She exhibits no edema or tenderness.  Neurological: She is alert and oriented to person, place, and time.  Skin: Skin is warm and dry. No rash noted. No erythema.  Psychiatric: She has a normal mood and affect. Her behavior is normal.  Nursing note and vitals reviewed.   ED Course  Procedures (including critical care time) Labs Review Labs Reviewed  URINALYSIS, ROUTINE W REFLEX MICROSCOPIC (NOT AT [facility]) - Abnormal; Notable for the following:    APPearance TURBID (*)    All other components within normal limits  CBC WITH DIFFERENTIAL/PLATELET - Abnormal; Notable for the following:    Hemoglobin 11.1 (*)    HCT 35.0 (*)    MCV 75.4 (*)    MCH 23.9 (*)    RDW 16.6 (*)    Platelets 421 (*)    All other components within normal limits  COMPREHENSIVE METABOLIC PANEL - Abnormal; Notable for the following:    Potassium 3.4 (*)    Glucose, Bld 111 (*)    Calcium 8.6 (*)    Total Bilirubin 0.2 (*)    All other components within normal limits  PREGNANCY, URINE  URINE MICROSCOPIC-ADD ON    Imaging Review Ct Abdomen Pelvis W Contrast  03/07/2015   CLINICAL DATA:  Right lower quadrant pain and tenderness for 7 days. Nausea.  EXAM: CT ABDOMEN AND PELVIS WITH CONTRAST  TECHNIQUE: Multidetector CT imaging of the abdomen and pelvis was performed using the standard protocol following bolus administration of intravenous contrast.  CONTRAST:  183mL OMNIPAQUE IOHEXOL 300 MG/ML SOLN, 1mL OMNIPAQUE IOHEXOL 300 MG/ML SOLN  COMPARISON:  None.  FINDINGS: Lower chest:  No significant abnormality  Hepatobiliary: There are normal appearances of the liver, gallbladder and bile ducts.  Pancreas: Normal  Spleen: Normal  Adrenals/Urinary Tract: The adrenals and kidneys are normal in appearance. There is no urinary calculus evident. There is no hydronephrosis or ureteral dilatation. Collecting systems and ureters appear  unremarkable.  Stomach/Bowel: The stomach, small bowel and colon are unremarkable. Minimal colonic diverticulosis noted.  Vascular/Lymphatic: The abdominal aorta is normal in caliber. There is no atherosclerotic calcification. There is no adenopathy in the abdomen or pelvis.  Reproductive: Uterus is enlarged with multiple small masses, presumably fibroids. The ovaries are remarkable only for a peripherally enhancing partially collapsed 1.7 cm cyst on the right. This is probably physiologic.  Other: There is a scant volume free pelvic fluid. No acute inflammatory changes are evident in the abdomen or pelvis.  Musculoskeletal: No significant musculoskeletal lesion.  IMPRESSION: Scant volume free pelvic fluid. Partially collapsed right ovarian cyst. Mildly enlarged multi fibroid uterus.   Electronically Signed   By: Andreas Newport M.D.   On: 03/07/2015 20:17   I have personally reviewed and evaluated these images and lab results as part of my medical decision-making.   EKG Interpretation None      MDM   Final diagnoses:  Right ovarian cyst  Uterine leiomyoma, unspecified  location    Patient is a 39 year old female with a significant medical history for 2 C-sections and a tubal ligation presents today with 7 days of worsening abdominal pain. Patient saw her PCP today and sent here for further care. Significant right lower quadrant tenderness on exam with guarding and rebound. Significant decreased by mouth intake and nausea with occasional diarrhea. No signs or symptoms of obstruction.  With patient's exam and history concern for appendicitis versus right-sided diverticulitis versus ruptured ovarian cyst. Patient has not been sexually active for over 3 years so low suspicion for pregnancy for STI. She denies any vaginal discharge or dysuria. Low suspicion for kidney stone.  CBC, CMP, UA, CT abdomen and pelvis pending. The CT will be with by mouth contrast only due to an IV dye allergy.  8:26  PM Labs are within normal limits. CT of the abdomen shows mild free fluid and partially collapsed right ovarian cyst with multiple enlarged uterine fibroids which are most likely the result of her pain. Will have patient follow-up with OB/GYN. She was given pain control.  Low suspicion for ovarian torsion at this time due to length of sx and no signs of ischemia today to ovary.  Blanchie Dessert, MD 03/07/15 2027  Blanchie Dessert, MD 03/07/15 2041

## 2015-03-07 NOTE — ED Notes (Signed)
Right lower quadrant pain x 7 days.

## 2015-03-08 NOTE — ED Notes (Signed)
Patient called and stated she was having an itching reaction to hydrocodone. Called in a prescription for tramadol 50mg  Q6hrs prn, 15 tabs, no refills per Dr Colin Rhein.

## 2015-07-06 ENCOUNTER — Emergency Department (HOSPITAL_BASED_OUTPATIENT_CLINIC_OR_DEPARTMENT_OTHER)
Admission: EM | Admit: 2015-07-06 | Discharge: 2015-07-06 | Disposition: A | Discharge status: Home / Self Care | Payer: 59 | Attending: Emergency Medicine | Admitting: Emergency Medicine

## 2015-07-06 ENCOUNTER — Encounter (HOSPITAL_BASED_OUTPATIENT_CLINIC_OR_DEPARTMENT_OTHER): Payer: Self-pay | Admitting: *Deleted

## 2015-07-06 DIAGNOSIS — F17210 Nicotine dependence, cigarettes, uncomplicated: Secondary | ICD-10-CM | POA: Diagnosis not present

## 2015-07-06 DIAGNOSIS — N390 Urinary tract infection, site not specified: Secondary | ICD-10-CM | POA: Diagnosis not present

## 2015-07-06 DIAGNOSIS — R300 Dysuria: Secondary | ICD-10-CM | POA: Diagnosis present

## 2015-07-06 LAB — URINALYSIS, ROUTINE W REFLEX MICROSCOPIC
Bilirubin Urine: NEGATIVE
GLUCOSE, UA: NEGATIVE mg/dL
KETONES UR: NEGATIVE mg/dL
NITRITE: NEGATIVE
PROTEIN: NEGATIVE mg/dL
Specific Gravity, Urine: 1.027 (ref 1.005–1.030)
pH: 6 (ref 5.0–8.0)

## 2015-07-06 LAB — URINE MICROSCOPIC-ADD ON

## 2015-07-06 MED ORDER — CEPHALEXIN 500 MG PO CAPS: 500.0000 mg | ORAL_CAPSULE | Freq: Two times a day (BID) | ORAL | Status: DC

## 2015-07-06 NOTE — ED Notes (Signed)
Dysuria and retention x 4 days.  Azo and amoxicillin(left over from previous dental infection) taken PTA.

## 2015-07-06 NOTE — ED Notes (Signed)
MD at bedside. 

## 2015-07-06 NOTE — ED Provider Notes (Signed)
CSN: [id number]     Arrival date & time 07/06/15  1413 History  By signing my name below, I, Jillian Baker, attest that this documentation has been prepared under the direction and in the presence of Jillian Belling, MD. Electronically Signed: Altamease Baker, ED Scribe. 07/06/2015. 4:47 PM  Chief Complaint  Patient presents with  . Dysuria   The history is provided by the patient. No language interpreter was used.   Jillian Baker is a 40 y.o. female who presents to the Emergency Department complaining of new dysuria with onset 4 days ago. Pt states that she has burning at the end of urination. She has been using left over Amoxicillin at home without relief. Associated symptoms include bilateral lower back pain. Pt denies vaginal bleeding and discharge. She also denies risk of STD.   History reviewed. No pertinent past medical history. Past Surgical History  Procedure Laterality Date  . Tubal ligation    . Cesarean section    . Abdominal hysterectomy     Family History  Problem Relation Age of Onset  . Asthma Mother   . COPD Father   . Pancreatic cancer Father   . Breast cancer Paternal Aunt   . Schizophrenia Paternal Aunt    Social History  Substance Use Topics  . Smoking status: Current Every Day Smoker -- 0.50 packs/day    Types: Cigarettes  . Smokeless tobacco: Never Used  . Alcohol Use: No   OB History    No data available     Review of Systems  Genitourinary: Positive for dysuria. Negative for vaginal bleeding and vaginal discharge.  Musculoskeletal: Positive for back pain.  All other systems reviewed and are negative.  Allergies  Codeine; Ivp dye; Percocet; and Shellfish allergy  Home Medications   Prior to Admission medications   Medication Sig Start Date End Date Taking? Authorizing Provider  cephALEXin (KEFLEX) 500 MG capsule Take 1 capsule (500 mg total) by mouth 2 (two) times daily. 07/06/15   Jillian Belling, MD   BP 114/88 mmHg  Pulse 103   Temp(Src) 98.5 F (36.9 C) (Oral)  Resp 18  Ht 5' (1.524 m)  Wt 210 lb (95.255 kg)  BMI 41.01 kg/m2  SpO2 94%  LMP 02/16/2015 Physical Exam  Constitutional: She is oriented to person, place, and time. She appears well-developed and well-nourished. No distress.  HENT:  Head: Normocephalic and atraumatic.  Eyes: EOM are normal.  Neck: Normal range of motion.  Cardiovascular: Normal rate, regular rhythm and normal heart sounds.   Pulmonary/Chest: Effort normal and breath sounds normal.  Abdominal: Soft. She exhibits no distension. There is tenderness (mild) in the suprapubic area. There is no CVA tenderness.  Musculoskeletal: Normal range of motion.  Neurological: She is alert and oriented to person, place, and time.  Skin: Skin is warm and dry.  Psychiatric: She has a normal mood and affect. Judgment normal.  Nursing note and vitals reviewed.   ED Course  Procedures (including critical care time) COORDINATION OF CARE: 4:46 PM Discussed treatment plan which includes UA with pt at bedside and pt agreed to plan.  Labs Review Labs Reviewed  URINALYSIS, ROUTINE W REFLEX MICROSCOPIC (NOT AT [facility]) - Abnormal; Notable for the following:    APPearance CLOUDY (*)    Hgb urine dipstick MODERATE (*)    Leukocytes, UA MODERATE (*)    All other components within normal limits  URINE MICROSCOPIC-ADD ON - Abnormal; Notable for the following:    Squamous Epithelial / LPF 0-5 (*)  Bacteria, UA MANY (*)    All other components within normal limits  URINE CULTURE    Imaging Review No results found. I have personally reviewed and evaluated these lab results as part of my medical decision-making.   EKG Interpretation None      MDM   Final diagnoses:  UTI (lower urinary tract infection)    Patient with UTI. Has been on amoxicillin and will change to Keflex. Culture sent. Will discharge home.  I personally performed the services described in this documentation, which was scribed  in my presence. The recorded information has been reviewed and is accurate.      Jillian Belling, MD 07/07/15 [id number]

## 2015-07-06 NOTE — ED Notes (Signed)
DC instructions and Rx reviewed with pt, pt teaching done re: completing all Rx as written by EDP, importance of handwashing, to avoid sitting in bathtub until infection has resolved and importance of increasing PO fluids. Opportunity for questions provided. Teach Back Method used

## 2015-07-06 NOTE — Discharge Instructions (Signed)

## 2015-07-06 NOTE — ED Notes (Signed)
Pt states has been taking amoxicillin for tooth infection and has used this secondary to UTI, EDP at bedside, urine culture ordered and implemented per EDP orders

## 2015-07-08 LAB — URINE CULTURE: Culture: 30000

## 2017-11-11 ENCOUNTER — Ambulatory Visit (HOSPITAL_COMMUNITY): Payer: Self-pay | Admitting: Psychiatry

## 2018-02-18 ENCOUNTER — Other Ambulatory Visit: Payer: Self-pay

## 2018-02-18 ENCOUNTER — Emergency Department (HOSPITAL_BASED_OUTPATIENT_CLINIC_OR_DEPARTMENT_OTHER): Payer: 59

## 2018-02-18 ENCOUNTER — Encounter (HOSPITAL_BASED_OUTPATIENT_CLINIC_OR_DEPARTMENT_OTHER): Payer: Self-pay | Admitting: Emergency Medicine

## 2018-02-18 ENCOUNTER — Emergency Department (HOSPITAL_BASED_OUTPATIENT_CLINIC_OR_DEPARTMENT_OTHER)
Admission: EM | Admit: 2018-02-18 | Discharge: 2018-02-18 | Disposition: A | Discharge status: Home / Self Care | Payer: 59 | Attending: Emergency Medicine | Admitting: Emergency Medicine

## 2018-02-18 DIAGNOSIS — R200 Anesthesia of skin: Secondary | ICD-10-CM | POA: Diagnosis not present

## 2018-02-18 DIAGNOSIS — F17210 Nicotine dependence, cigarettes, uncomplicated: Secondary | ICD-10-CM | POA: Insufficient documentation

## 2018-02-18 DIAGNOSIS — R202 Paresthesia of skin: Secondary | ICD-10-CM

## 2018-02-18 DIAGNOSIS — H538 Other visual disturbances: Secondary | ICD-10-CM | POA: Insufficient documentation

## 2018-02-18 DIAGNOSIS — Z79899 Other long term (current) drug therapy: Secondary | ICD-10-CM | POA: Diagnosis not present

## 2018-02-18 DIAGNOSIS — R531 Weakness: Secondary | ICD-10-CM | POA: Diagnosis present

## 2018-02-18 DIAGNOSIS — R079 Chest pain, unspecified: Secondary | ICD-10-CM | POA: Diagnosis not present

## 2018-02-18 HISTORY — DX: Restless legs syndrome: G25.81

## 2018-02-18 LAB — COMPREHENSIVE METABOLIC PANEL
ALK PHOS: 72 U/L (ref 38–126)
ALT: 16 U/L (ref 0–44)
AST: 17 U/L (ref 15–41)
Albumin: 3.8 g/dL (ref 3.5–5.0)
Anion gap: 7 (ref 5–15)
BILIRUBIN TOTAL: 0.3 mg/dL (ref 0.3–1.2)
BUN: 10 mg/dL (ref 6–20)
CALCIUM: 8.7 mg/dL — AB (ref 8.9–10.3)
CHLORIDE: 102 mmol/L (ref 98–111)
CO2: 26 mmol/L (ref 22–32)
Creatinine, Ser: 0.51 mg/dL (ref 0.44–1.00)
GFR calc Af Amer: >60 mL/min (ref >60)
Glucose, Bld: 88 mg/dL (ref 70–99)
Potassium: 3.7 mmol/L (ref 3.5–5.1)
SODIUM: 135 mmol/L (ref 135–145)
TOTAL PROTEIN: 7.6 g/dL (ref 6.5–8.1)

## 2018-02-18 LAB — DIFFERENTIAL
BASOS PCT: 0 %
Basophils Absolute: 0 10*3/uL (ref 0.0–0.1)
Eosinophils Absolute: 0.2 10*3/uL (ref 0.0–0.7)
Eosinophils Relative: 2 %
Lymphocytes Relative: 17 %
Lymphs Abs: 1.8 10*3/uL (ref 0.7–4.0)
MONO ABS: 0.7 10*3/uL (ref 0.1–1.0)
MONOS PCT: 7 %
Neutro Abs: 8 10*3/uL — ABNORMAL HIGH (ref 1.7–7.7)
Neutrophils Relative %: 74 %

## 2018-02-18 LAB — CBC
HEMATOCRIT: 40.3 % (ref 36.0–46.0)
Hemoglobin: 13.4 g/dL (ref 12.0–15.0)
MCH: 27.7 pg (ref 26.0–34.0)
MCHC: 33.3 g/dL (ref 30.0–36.0)
MCV: 83.4 fL (ref 78.0–100.0)
PLATELETS: 427 10*3/uL — AB (ref 150–400)
RBC: 4.83 MIL/uL (ref 3.87–5.11)
RDW: 14.9 % (ref 11.5–15.5)
WBC: 10.7 10*3/uL — AB (ref 4.0–10.5)

## 2018-02-18 LAB — TROPONIN I: Troponin I: <0.03 ng/mL (ref <0.03)

## 2018-02-18 LAB — PROTIME-INR
INR: 0.97
PROTHROMBIN TIME: 12.8 s (ref 11.4–15.2)

## 2018-02-18 LAB — APTT: aPTT: 36 seconds (ref 24–36)

## 2018-02-18 NOTE — ED Triage Notes (Signed)
Patient states that she has been "foggy" for the last 2 -3 days and states that she  Started to have right sided numbness and weakness tarted last night - went to the urgent care today and was sent her for abnormal strengths

## 2018-02-18 NOTE — ED Notes (Signed)
Pt in MRI.

## 2018-02-18 NOTE — ED Provider Notes (Addendum)
[facility] EMERGENCY DEPARTMENT Provider Note   CSN: [id number] Arrival date & time: 02/18/18  1710     History   Chief Complaint Chief Complaint  Patient presents with  . Extremity Weakness    HPI Jillian Baker is a 42 y.o. female.  Patient is a 42 year old female with no significant past medical history who presents with right side numbness and weakness.  She states that about 5 days ago she started noticing some fogginess and that she was forgetting things at work.  She then started noticing some numbness and tingling to the right side of her face which includes the forehead.  This started about 2 to 3 days ago.  Around the same time she noticed that she was having some tingling in her right arm and right leg.  She feels like the right arm and right leg is weaker than normal as well.  She also notes some discomfort in the forearm of her right arm.  She notes some soreness in her right chest and right shoulder.  She denies any visible swelling.  She denies any difficulty with her balance.  She does note some blurry vision in her right eye.  She denies any speech deficits.  No history of similar symptoms in the past.  She has a history of migraines but denies any current headache.     Past Medical History:  Diagnosis Date  . Restless leg     Patient Active Problem List   Diagnosis Date Noted  . Vertigo 05/14/2013  . Chest discomfort 05/14/2013  . Nicotine dependence 05/14/2013    Past Surgical History:  Procedure Laterality Date  . ABDOMINAL HYSTERECTOMY    . CESAREAN SECTION    . TUBAL LIGATION       OB History   None      Home Medications    Prior to Admission medications   Medication Sig Start Date End Date Taking? Authorizing Provider  sertraline (ZOLOFT) 100 MG tablet Take 100 mg by mouth daily.   Yes [provider]  tizanidine (ZANAFLEX) 2 MG capsule Take 2 mg by mouth 3 (three) times daily.   Yes [provider]  traMADol  (ULTRAM) 50 MG tablet Take by mouth every 6 (six) hours as needed.   Yes [provider]  cephALEXin (KEFLEX) 500 MG capsule Take 1 capsule (500 mg total) by mouth 2 (two) times daily. 07/06/15   Davonna Belling, MD    Family History Family History  Problem Relation Age of Onset  . Asthma Mother   . COPD Father   . Pancreatic cancer Father   . Breast cancer Paternal Aunt   . Schizophrenia Paternal Aunt     Social History Social History   Tobacco Use  . Smoking status: Current Every Day Smoker    Packs/day: 0.50    Types: Cigarettes  . Smokeless tobacco: Never Used  Substance Use Topics  . Alcohol use: No  . Drug use: No     Allergies   Codeine; Ivp dye [iodinated diagnostic agents]; Percocet [oxycodone-acetaminophen]; and Shellfish allergy   Review of Systems Review of Systems  Constitutional: Negative for chills, diaphoresis, fatigue and fever.  HENT: Negative for congestion, rhinorrhea and sneezing.   Eyes: Negative.   Respiratory: Negative for cough, chest tightness and shortness of breath.   Cardiovascular: Positive for chest pain. Negative for leg swelling.  Gastrointestinal: Negative for abdominal pain, blood in stool, diarrhea, nausea and vomiting.  Genitourinary: Negative for difficulty urinating, flank  pain, frequency and hematuria.  Musculoskeletal: Positive for arthralgias. Negative for back pain.  Skin: Negative for rash.  Neurological: Positive for weakness and numbness. Negative for dizziness, speech difficulty and headaches.     Physical Exam Updated Vital Signs BP 124/90 (BP Location: Left Arm)   Pulse 80   Temp 98.7 F (37.1 C) (Oral)   Resp 18   Ht 5' (1.524 m)   Wt 97.5 kg   LMP 02/16/2015   SpO2 100%   BMI 41.99 kg/m   Physical Exam  Constitutional: She is oriented to person, place, and time. She appears well-developed and well-nourished.  HENT:  Head: Normocephalic and atraumatic.  Eyes: Pupils are equal, round, and  reactive to light.  Neck: Normal range of motion. Neck supple.  Cardiovascular: Normal rate, regular rhythm and normal heart sounds.  Pulmonary/Chest: Effort normal and breath sounds normal. No respiratory distress. She has no wheezes. She has no rales. She exhibits tenderness.  Patient has some slight reproducible tenderness to the right chest wall, no crepitus or deformity  Abdominal: Soft. Bowel sounds are normal. There is no tenderness. There is no rebound and no guarding.  Musculoskeletal: Normal range of motion. She exhibits no edema.  Lymphadenopathy:    She has no cervical adenopathy.  Neurological: She is alert and oriented to person, place, and time.  Patient has 5 out of 5 strength in all extremities, it seems slightly reduced in the right upper extremity in the right lower extremity although her exam is not consistent.  She has some subjective numbness to light touch in the right upper extremity in the right lower extremity as compared to the left.  She also has some diminished sensation to light touch in the right side of the face, including the forehead.  No facial drooping, extraocular eye movements are intact, she has a diminished shoulder shrug but I question her effort in this.  No other cranial nerve abnormalities are noted.  Finger-nose is intact, no pronator drift  Skin: Skin is warm and dry. No rash noted.  Psychiatric: She has a normal mood and affect.     ED Treatments / Results  Labs (all labs ordered are listed, but only abnormal results are displayed) Labs Reviewed  CBC - Abnormal; Notable for the following components:      Result Value   WBC 10.7 (*)    Platelets 427 (*)    All other components within normal limits  DIFFERENTIAL - Abnormal; Notable for the following components:   Neutro Abs 8.0 (*)    All other components within normal limits  COMPREHENSIVE METABOLIC PANEL - Abnormal; Notable for the following components:   Calcium 8.7 (*)    All other  components within normal limits  PROTIME-INR  APTT  TROPONIN I  CBG MONITORING, ED    EKG EKG Interpretation  Date/Time:  Saturday February 18 2018 17:32:07 EDT Ventricular Rate:  83 PR Interval:    QRS Duration: 93 QT Interval:  355 QTC Calculation: 418 R Axis:   -30 Text Interpretation:  Sinus rhythm Left axis deviation Low voltage, precordial leads Consider anterior infarct since last tracing no significant change Confirmed by Malvin Johns [phone]) on 02/18/2018 5:35:02 PM   Radiology Mr Brain Wo Contrast  Result Date: 02/18/2018 CLINICAL DATA:  RIGHT-sided facial tingling, RIGHT arm numbness and weakness. Visual disturbance. EXAM: MRI HEAD WITHOUT CONTRAST TECHNIQUE: Multiplanar, multiecho pulse sequences of the brain and surrounding structures were obtained without intravenous contrast. COMPARISON:  MRI 05/14/2013. FINDINGS:  Brain: No acute infarction, hemorrhage, hydrocephalus, extra-axial collection or mass lesion. Normal cerebral volume. No white matter disease. Vascular: Normal flow voids. Skull and upper cervical spine: Normal marrow signal. Sinuses/Orbits: Negative. Other: None. IMPRESSION: Negative exam.  No change from priors. Electronically Signed   By: Staci Righter M.D.   On: 02/18/2018 19:02    Procedures Procedures (including critical care time)  Medications Ordered in ED Medications - No data to display   Initial Impression / Assessment and Plan / ED Course  I have reviewed the triage vital signs and the nursing notes.  Pertinent labs & imaging results that were available during my care of the patient were reviewed by me and considered in my medical decision making (see chart for details).     Patient is a 42 year old female who presents with right arm and leg numbness and weakness with right face numbness.  Her exam is not very consistent.  She does not seem to be completely cooperating with exam.  She did have an MRI which shows no evidence of acute  infarct, mass or other abnormality.  She is otherwise well-appearing.  She was tearful on arrival and I question whether this is due to somatization.  Her labs are non-concerning.  She is able to ambulate without any ataxia or evident weakness.  She was discharged home in good condition.  She was encouraged to have close follow-up with her PCP.  Return precautions were given.  She did request a sling for her right arm and complains of pain to the arm.  She has no specific bony tenderness.  No evidence of vascular compromise.  Full ROM in arm.  Final Clinical Impressions(s) / ED Diagnoses   Final diagnoses:  Paresthesias    ED Discharge Orders    None       Malvin Johns, MD 02/18/18 1956    Malvin Johns, MD 02/18/18 [phone]

## 2018-04-07 ENCOUNTER — Ambulatory Visit (INDEPENDENT_AMBULATORY_CARE_PROVIDER_SITE_OTHER): Payer: Self-pay | Admitting: Orthopaedic Surgery

## 2019-05-17 ENCOUNTER — Ambulatory Visit (INDEPENDENT_AMBULATORY_CARE_PROVIDER_SITE_OTHER): Payer: 59 | Admitting: Psychology

## 2019-05-17 DIAGNOSIS — F4323 Adjustment disorder with mixed anxiety and depressed mood: Secondary | ICD-10-CM | POA: Diagnosis not present

## 2019-05-22 ENCOUNTER — Ambulatory Visit (INDEPENDENT_AMBULATORY_CARE_PROVIDER_SITE_OTHER): Payer: 59 | Admitting: Psychology

## 2019-05-22 DIAGNOSIS — F4323 Adjustment disorder with mixed anxiety and depressed mood: Secondary | ICD-10-CM

## 2019-05-29 ENCOUNTER — Ambulatory Visit (INDEPENDENT_AMBULATORY_CARE_PROVIDER_SITE_OTHER): Payer: 59 | Admitting: Psychology

## 2019-05-29 DIAGNOSIS — F4323 Adjustment disorder with mixed anxiety and depressed mood: Secondary | ICD-10-CM | POA: Diagnosis not present

## 2019-06-15 ENCOUNTER — Ambulatory Visit (INDEPENDENT_AMBULATORY_CARE_PROVIDER_SITE_OTHER): Payer: 59 | Admitting: Psychology

## 2019-06-15 DIAGNOSIS — F4323 Adjustment disorder with mixed anxiety and depressed mood: Secondary | ICD-10-CM | POA: Diagnosis not present

## 2019-06-28 ENCOUNTER — Ambulatory Visit: Payer: 59 | Admitting: Psychology

## 2019-06-29 ENCOUNTER — Ambulatory Visit: Payer: 59 | Admitting: Psychology

## 2019-06-29 ENCOUNTER — Ambulatory Visit (INDEPENDENT_AMBULATORY_CARE_PROVIDER_SITE_OTHER): Payer: 59 | Admitting: Psychology

## 2019-06-29 DIAGNOSIS — F4323 Adjustment disorder with mixed anxiety and depressed mood: Secondary | ICD-10-CM

## 2019-07-09 ENCOUNTER — Ambulatory Visit (INDEPENDENT_AMBULATORY_CARE_PROVIDER_SITE_OTHER): Payer: 59 | Admitting: Psychology

## 2019-07-09 DIAGNOSIS — F4323 Adjustment disorder with mixed anxiety and depressed mood: Secondary | ICD-10-CM

## 2019-08-02 ENCOUNTER — Ambulatory Visit (INDEPENDENT_AMBULATORY_CARE_PROVIDER_SITE_OTHER): Payer: 59 | Admitting: Psychology

## 2019-08-02 DIAGNOSIS — F4323 Adjustment disorder with mixed anxiety and depressed mood: Secondary | ICD-10-CM

## 2019-08-07 ENCOUNTER — Ambulatory Visit (INDEPENDENT_AMBULATORY_CARE_PROVIDER_SITE_OTHER): Payer: 59 | Admitting: Psychology

## 2019-08-07 DIAGNOSIS — F4323 Adjustment disorder with mixed anxiety and depressed mood: Secondary | ICD-10-CM

## 2019-08-20 ENCOUNTER — Ambulatory Visit (INDEPENDENT_AMBULATORY_CARE_PROVIDER_SITE_OTHER): Payer: 59 | Admitting: Psychology

## 2019-08-20 DIAGNOSIS — F4323 Adjustment disorder with mixed anxiety and depressed mood: Secondary | ICD-10-CM

## 2019-09-04 ENCOUNTER — Ambulatory Visit (INDEPENDENT_AMBULATORY_CARE_PROVIDER_SITE_OTHER): Payer: 59 | Admitting: Psychology

## 2019-09-04 DIAGNOSIS — F4323 Adjustment disorder with mixed anxiety and depressed mood: Secondary | ICD-10-CM

## 2019-09-17 ENCOUNTER — Ambulatory Visit (INDEPENDENT_AMBULATORY_CARE_PROVIDER_SITE_OTHER): Payer: 59 | Admitting: Psychology

## 2019-09-17 DIAGNOSIS — F4323 Adjustment disorder with mixed anxiety and depressed mood: Secondary | ICD-10-CM | POA: Diagnosis not present

## 2019-10-11 ENCOUNTER — Ambulatory Visit (INDEPENDENT_AMBULATORY_CARE_PROVIDER_SITE_OTHER): Payer: 59 | Admitting: Psychology

## 2019-10-11 DIAGNOSIS — F4323 Adjustment disorder with mixed anxiety and depressed mood: Secondary | ICD-10-CM | POA: Diagnosis not present

## 2019-10-23 ENCOUNTER — Ambulatory Visit (INDEPENDENT_AMBULATORY_CARE_PROVIDER_SITE_OTHER): Payer: 59 | Admitting: Psychology

## 2019-10-23 DIAGNOSIS — F4323 Adjustment disorder with mixed anxiety and depressed mood: Secondary | ICD-10-CM

## 2019-11-06 ENCOUNTER — Ambulatory Visit (INDEPENDENT_AMBULATORY_CARE_PROVIDER_SITE_OTHER): Payer: 59 | Admitting: Psychology

## 2019-11-06 DIAGNOSIS — F4323 Adjustment disorder with mixed anxiety and depressed mood: Secondary | ICD-10-CM | POA: Diagnosis not present

## 2019-11-15 ENCOUNTER — Ambulatory Visit (INDEPENDENT_AMBULATORY_CARE_PROVIDER_SITE_OTHER): Payer: 59 | Admitting: Psychology

## 2019-11-15 DIAGNOSIS — F4323 Adjustment disorder with mixed anxiety and depressed mood: Secondary | ICD-10-CM

## 2020-01-08 ENCOUNTER — Ambulatory Visit (INDEPENDENT_AMBULATORY_CARE_PROVIDER_SITE_OTHER): Payer: 59 | Admitting: Psychology

## 2020-01-08 DIAGNOSIS — F4323 Adjustment disorder with mixed anxiety and depressed mood: Secondary | ICD-10-CM

## 2020-01-17 ENCOUNTER — Ambulatory Visit (INDEPENDENT_AMBULATORY_CARE_PROVIDER_SITE_OTHER): Payer: 59 | Admitting: Psychology

## 2020-01-17 DIAGNOSIS — F4323 Adjustment disorder with mixed anxiety and depressed mood: Secondary | ICD-10-CM

## 2020-01-31 ENCOUNTER — Ambulatory Visit (INDEPENDENT_AMBULATORY_CARE_PROVIDER_SITE_OTHER): Payer: 59 | Admitting: Psychology

## 2020-01-31 DIAGNOSIS — F4323 Adjustment disorder with mixed anxiety and depressed mood: Secondary | ICD-10-CM

## 2020-02-19 ENCOUNTER — Ambulatory Visit (INDEPENDENT_AMBULATORY_CARE_PROVIDER_SITE_OTHER): Payer: 59 | Admitting: Psychology

## 2020-02-19 DIAGNOSIS — F4323 Adjustment disorder with mixed anxiety and depressed mood: Secondary | ICD-10-CM

## 2020-03-04 ENCOUNTER — Ambulatory Visit (INDEPENDENT_AMBULATORY_CARE_PROVIDER_SITE_OTHER): Payer: 59 | Admitting: Psychology

## 2020-03-04 DIAGNOSIS — F4323 Adjustment disorder with mixed anxiety and depressed mood: Secondary | ICD-10-CM

## 2020-03-20 ENCOUNTER — Ambulatory Visit: Payer: 59 | Admitting: Psychology

## 2020-03-24 ENCOUNTER — Other Ambulatory Visit: Payer: Self-pay

## 2020-03-24 DIAGNOSIS — Z20822 Contact with and (suspected) exposure to covid-19: Secondary | ICD-10-CM

## 2020-03-25 LAB — SARS-COV-2, NAA 2 DAY TAT

## 2020-03-25 LAB — NOVEL CORONAVIRUS, NAA: SARS-CoV-2, NAA: NOT DETECTED

## 2020-04-04 ENCOUNTER — Ambulatory Visit: Payer: 59 | Admitting: Psychology

## 2020-05-15 ENCOUNTER — Ambulatory Visit (INDEPENDENT_AMBULATORY_CARE_PROVIDER_SITE_OTHER): Payer: 59 | Admitting: Psychology

## 2020-05-15 DIAGNOSIS — F4323 Adjustment disorder with mixed anxiety and depressed mood: Secondary | ICD-10-CM | POA: Diagnosis not present

## 2020-05-20 ENCOUNTER — Ambulatory Visit (INDEPENDENT_AMBULATORY_CARE_PROVIDER_SITE_OTHER): Payer: 59 | Admitting: Psychology

## 2020-05-20 DIAGNOSIS — F4323 Adjustment disorder with mixed anxiety and depressed mood: Secondary | ICD-10-CM | POA: Diagnosis not present

## 2020-06-09 ENCOUNTER — Other Ambulatory Visit: Payer: PRIVATE HEALTH INSURANCE

## 2020-06-09 ENCOUNTER — Other Ambulatory Visit: Payer: Self-pay

## 2020-06-09 DIAGNOSIS — Z20822 Contact with and (suspected) exposure to covid-19: Secondary | ICD-10-CM

## 2020-06-09 NOTE — Addendum Note (Signed)
Addended by: Hezzie Bump A on: 06/09/2020 09:22 AM   Modules accepted: Orders

## 2020-06-12 ENCOUNTER — Telehealth: Payer: Self-pay | Admitting: *Deleted

## 2020-06-12 LAB — NOVEL CORONAVIRUS, NAA: SARS-CoV-2, NAA: NOT DETECTED

## 2020-06-12 NOTE — Telephone Encounter (Signed)
Patient asked for her results while I was on the phone giving her daughter's results, advised not resulted and to call back.

## 2020-06-12 NOTE — Telephone Encounter (Signed)
Patient called for results advised they are still pending and to call back.

## 2020-06-17 ENCOUNTER — Other Ambulatory Visit: Payer: PRIVATE HEALTH INSURANCE

## 2020-06-18 ENCOUNTER — Other Ambulatory Visit: Payer: PRIVATE HEALTH INSURANCE

## 2020-06-18 ENCOUNTER — Other Ambulatory Visit: Payer: Self-pay

## 2020-06-18 DIAGNOSIS — Z20822 Contact with and (suspected) exposure to covid-19: Secondary | ICD-10-CM

## 2020-06-20 LAB — NOVEL CORONAVIRUS, NAA: SARS-CoV-2, NAA: NOT DETECTED

## 2020-06-20 LAB — SARS-COV-2, NAA 2 DAY TAT

## 2020-07-17 ENCOUNTER — Other Ambulatory Visit: Payer: PRIVATE HEALTH INSURANCE

## 2020-07-17 DIAGNOSIS — Z20822 Contact with and (suspected) exposure to covid-19: Secondary | ICD-10-CM

## 2020-07-18 LAB — SARS-COV-2, NAA 2 DAY TAT

## 2020-07-18 LAB — NOVEL CORONAVIRUS, NAA: SARS-CoV-2, NAA: NOT DETECTED

## 2020-07-24 ENCOUNTER — Other Ambulatory Visit: Payer: PRIVATE HEALTH INSURANCE

## 2020-08-20 ENCOUNTER — Ambulatory Visit: Payer: 59 | Admitting: Psychology

## 2020-08-20 ENCOUNTER — Emergency Department (HOSPITAL_BASED_OUTPATIENT_CLINIC_OR_DEPARTMENT_OTHER)
Admission: EM | Admit: 2020-08-20 | Discharge: 2020-08-20 | Disposition: A | Discharge status: Home / Self Care | Payer: 59 | Attending: Emergency Medicine | Admitting: Emergency Medicine

## 2020-08-20 ENCOUNTER — Encounter (HOSPITAL_BASED_OUTPATIENT_CLINIC_OR_DEPARTMENT_OTHER): Payer: Self-pay | Admitting: Emergency Medicine

## 2020-08-20 ENCOUNTER — Other Ambulatory Visit: Payer: Self-pay

## 2020-08-20 DIAGNOSIS — N390 Urinary tract infection, site not specified: Secondary | ICD-10-CM | POA: Diagnosis not present

## 2020-08-20 DIAGNOSIS — Z87891 Personal history of nicotine dependence: Secondary | ICD-10-CM | POA: Insufficient documentation

## 2020-08-20 DIAGNOSIS — R1030 Lower abdominal pain, unspecified: Secondary | ICD-10-CM | POA: Diagnosis present

## 2020-08-20 LAB — URINALYSIS, ROUTINE W REFLEX MICROSCOPIC
Bilirubin Urine: NEGATIVE
Glucose, UA: NEGATIVE mg/dL
Ketones, ur: NEGATIVE mg/dL
Nitrite: NEGATIVE
Protein, ur: NEGATIVE mg/dL
Specific Gravity, Urine: <1.005 — ABNORMAL LOW (ref 1.005–1.030)
pH: 6.5 (ref 5.0–8.0)

## 2020-08-20 LAB — CBC
HCT: 38.9 % (ref 36.0–46.0)
Hemoglobin: 12.7 g/dL (ref 12.0–15.0)
MCH: 27.9 pg (ref 26.0–34.0)
MCHC: 32.6 g/dL (ref 30.0–36.0)
MCV: 85.3 fL (ref 80.0–100.0)
Platelets: 417 10*3/uL — ABNORMAL HIGH (ref 150–400)
RBC: 4.56 MIL/uL (ref 3.87–5.11)
RDW: 13.8 % (ref 11.5–15.5)
WBC: 11.4 10*3/uL — ABNORMAL HIGH (ref 4.0–10.5)
nRBC: 0 % (ref 0.0–0.2)

## 2020-08-20 LAB — BASIC METABOLIC PANEL
Anion gap: 10 (ref 5–15)
BUN: 8 mg/dL (ref 6–20)
CO2: 23 mmol/L (ref 22–32)
Calcium: 8.7 mg/dL — ABNORMAL LOW (ref 8.9–10.3)
Chloride: 103 mmol/L (ref 98–111)
Creatinine, Ser: 0.66 mg/dL (ref 0.44–1.00)
GFR, Estimated: >60 mL/min (ref >60)
Glucose, Bld: 91 mg/dL (ref 70–99)
Potassium: 3.5 mmol/L (ref 3.5–5.1)
Sodium: 136 mmol/L (ref 135–145)

## 2020-08-20 LAB — URINALYSIS, MICROSCOPIC (REFLEX)

## 2020-08-20 MED ORDER — CEPHALEXIN 500 MG PO CAPS: 500.0000 mg | ORAL_CAPSULE | Freq: Three times a day (TID) | ORAL | 0 refills | Status: AC

## 2020-08-20 MED ORDER — CEPHALEXIN 250 MG PO CAPS
500.0000 mg | ORAL_CAPSULE | Freq: Once | ORAL | Status: AC
  Administered 2020-08-20: 500 mg via ORAL
  Filled 2020-08-20: qty 2

## 2020-08-20 MED ORDER — ONDANSETRON 4 MG PO TBDP: 4.0000 mg | ORAL_TABLET | Freq: Three times a day (TID) | ORAL | 0 refills | Status: DC | PRN

## 2020-08-20 MED ORDER — ONDANSETRON 4 MG PO TBDP
4.0000 mg | ORAL_TABLET | Freq: Once | ORAL | Status: AC
  Administered 2020-08-20: 4 mg via ORAL
  Filled 2020-08-20: qty 1

## 2020-08-20 NOTE — ED Provider Notes (Signed)
[facility] Provider Note   CSN: [id number] Arrival date & time: 08/20/20  2038     History Chief Complaint  Patient presents with  . Flank Pain  . Urinary Retention    Jillian Baker is a 45 y.o. female past medical history of cholecystectomy, abdominal hysterectomy, right ovarian cyst, presenting for evaluation of lower abdominal pain with hematuria.  She states over the last multiple months she has had right lower back pain, initially was thought to be musculoskeletal and she went to PT however then it was thought to be related to something else.  She also states she has had microscopic hematuria in her UA for some time now by her PCP though never a UTI.  Over the last few days she has developed sensation that she has a full bladder though whenever she urinates she only dribbles and frequently does throughout the night.  Today she developed gross hematuria with some clots in her urine.  She continues to have pain in her lower abdomen and right lower back.  Is also felt some nausea over the last few days without vomiting.  No fevers, no vaginal discharge or bleeding, no reported diarrhea or constipation, no dysuria.  She is scheduled for urology appointment on Tuesday next week for further evaluation of her ongoing symptoms.  Per chart review in care everywhere, patient recently had complete abdominal and pelvic ultrasound on 08/11/2020 which showed right ovarian cyst 2.2 cm in size.  It was noted to have normal kidneys and ureters on the ultrasound without any hydro or stones.  She also had CT stone study in September 2021 which showed no abnormalities about the urinary tract including no stones or masses.  Did also note right ovarian cyst at the time.  The history is provided by the patient.       Past Medical History:  Diagnosis Date  . Restless leg     Patient Active Problem List   Diagnosis Date Noted  . Vertigo 05/14/2013  . Chest discomfort  05/14/2013  . Nicotine dependence 05/14/2013    Past Surgical History:  Procedure Laterality Date  . ABDOMINAL HYSTERECTOMY    . CESAREAN SECTION    . TUBAL LIGATION       OB History   No obstetric history on file.     Family History  Problem Relation Age of Onset  . Asthma Mother   . COPD Father   . Pancreatic cancer Father   . Breast cancer Paternal Aunt   . Schizophrenia Paternal Aunt     Social History   Tobacco Use  . Smoking status: Former Smoker    Types: Cigarettes    Quit date: 04/30/2018    Years since quitting: 2.3  . Smokeless tobacco: Never Used  Vaping Use  . Vaping Use: Never used  Substance Use Topics  . Alcohol use: No  . Drug use: No    Home Medications Prior to Admission medications   Medication Sig Start Date End Date Taking? Authorizing Provider  ondansetron (ZOFRAN ODT) 4 MG disintegrating tablet Take 1 tablet (4 mg total) by mouth every 8 (eight) hours as needed for nausea or vomiting. 08/20/20  Yes Rio Taber, [place] N, PA-C  cephALEXin (KEFLEX) 500 MG capsule Take 1 capsule (500 mg total) by mouth 3 (three) times daily for 14 days. 08/20/20 09/03/20  Maraya Gwilliam, [place] N, PA-C  sertraline (ZOLOFT) 100 MG tablet Take 100 mg by mouth daily.    [provider]  tizanidine (ZANAFLEX) 2 MG capsule Take 2 mg by mouth 3 (three) times daily.    [provider]  traMADol (ULTRAM) 50 MG tablet Take by mouth every 6 (six) hours as needed.    [provider]    Allergies    Codeine, Ivp dye [iodinated diagnostic agents], Percocet [oxycodone-acetaminophen], and Shellfish allergy  Review of Systems   Review of Systems  Gastrointestinal: Positive for abdominal pain and nausea. Negative for vomiting.  Genitourinary: Positive for frequency, hematuria and urgency.  Musculoskeletal: Positive for back pain.  All other systems reviewed and are negative.   Physical Exam Updated Vital Signs BP 127/83 (BP Location: Right Arm)   Pulse  89   Temp 98.2 F (36.8 C) (Oral)   Resp 16   Ht 5' (1.524 m)   Wt 95.3 kg   LMP 02/16/2015   SpO2 97%   BMI 41.01 kg/m   Physical Exam Vitals and nursing note reviewed.  Constitutional:      General: She is not in acute distress.    Appearance: She is well-developed. She is obese.  HENT:     Head: Normocephalic and atraumatic.  Eyes:     Conjunctiva/sclera: Conjunctivae normal.  Cardiovascular:     Rate and Rhythm: Normal rate and regular rhythm.  Pulmonary:     Effort: Pulmonary effort is normal. No respiratory distress.     Breath sounds: Normal breath sounds.  Abdominal:     General: Bowel sounds are normal. There is no distension.     Palpations: Abdomen is soft.     Tenderness: There is abdominal tenderness (Suprapubic). There is no right CVA tenderness, left CVA tenderness, guarding or rebound.  Musculoskeletal:     Comments: TTP to right lower lumbar region  Skin:    General: Skin is warm.  Neurological:     Mental Status: She is alert.  Psychiatric:        Behavior: Behavior normal.     ED Results / Procedures / Treatments   Labs (all labs ordered are listed, but only abnormal results are displayed) Labs Reviewed  URINALYSIS, ROUTINE W REFLEX MICROSCOPIC - Abnormal; Notable for the following components:      Result Value   Specific Gravity, Urine <1.005 (*)    Hgb urine dipstick LARGE (*)    Leukocytes,Ua LARGE (*)    All other components within normal limits  BASIC METABOLIC PANEL - Abnormal; Notable for the following components:   Calcium 8.7 (*)    All other components within normal limits  CBC - Abnormal; Notable for the following components:   WBC 11.4 (*)    Platelets 417 (*)    All other components within normal limits  URINALYSIS, MICROSCOPIC (REFLEX) - Abnormal; Notable for the following components:   Bacteria, UA MANY (*)    All other components within normal limits  URINE CULTURE    EKG None  Radiology No results  found.  Procedures Procedures   Medications Ordered in ED Medications  cephALEXin (KEFLEX) capsule 500 mg (500 mg Oral Given 08/20/20 2209)  ondansetron (ZOFRAN-ODT) disintegrating tablet 4 mg (4 mg Oral Given 08/20/20 2209)    ED Course  I have reviewed the triage vital signs and the nursing notes.  Pertinent labs & imaging results that were available during my care of the patient were reviewed by me and considered in my medical decision making (see chart for details).    MDM Rules/Calculators/A&P  Patient presenting for evaluation of hematuria, urinary frequency and what appears to be urgency, as well as a chronic low back pain on the right.  She has outpatient follow-up with urology on Tuesday for further assessment of these ongoing symptoms, however the gross hematuria is new as along with the urinary symptoms.  She had abdominal ultrasound and pelvic ultrasound done earlier this month which showed right ovarian cyst which was also present on stone study done in September of last year.  No hydronephrosis, nephrolithiasis or renal masses noted.  She is status post hysterectomy and cholecystectomy.  No evidence of retained stone in the duct either on ultrasound. Postvoid residual here is minimal, unlikely to be urinary tension and favors urinary urgency with frequency.  Her UA shows large hemoglobin and is consistent with infection with large leuks, 11-20 white cells and many bacteria, 0-5 squamous are noted.  Urine culture is sent.  Labs are reassuring, very slight leukocytosis, normal renal function.  Symptoms are consistent with UTI, however considering this right-sided low back pain which may be chronic though she is having some nausea over the few days as well, will cover for pyelonephritis.  Had discussion with patient regarding CT stone study today for further assessment to evaluate for stone however patient states she would prefer not due to cost of the study.   Believe this is reasonable given recent ultrasound study and close urology follow-up next week.  She is instructed of strict return precautions and verbalized understanding of this.  Seems reliable for follow-up.  Patient is discharged in no distress.  Discussed results, findings, treatment and follow up. Patient advised of return precautions. Patient verbalized understanding and agreed with plan.  Final Clinical Impression(s) / ED Diagnoses Final diagnoses:  Acute UTI (urinary tract infection)    Rx / DC Orders ED Discharge Orders         Ordered    cephALEXin (KEFLEX) 500 MG capsule  3 times daily        08/20/20 2202    ondansetron (ZOFRAN ODT) 4 MG disintegrating tablet  Every 8 hours PRN        08/20/20 2202           Merland Holness, [place] N, PA-C 08/20/20 2217    Luna Fuse, MD 08/22/20 [phone]

## 2020-08-20 NOTE — ED Triage Notes (Addendum)
Patient arrived via POV c/o right flank pain with urinary retention. Patient states last urination at 1500.Patient states being seen for right ovary cyst with some retention. Patient states supposed to be seen by urology on tomorrow, office rescheduled to Tuesday. Patient is AO x 4, VS WDL, normal gait.  Patient able to provide urine sample but states "she still feels full".

## 2020-08-20 NOTE — ED Notes (Signed)
Patient bladder scanned per MD order. 25 ml noted on bladder scanner. Pt reports having voided before bladder scan.

## 2020-08-20 NOTE — Discharge Instructions (Addendum)
It is important that you stay hydrated. Take the antibiotic, kefex, as directed until gone or as otherwise instructed by urology. You can take zofran every 8 hours for nausea. Follow-up with your primary care provider or the urologist within 3 to 5 days. Return to the emergency department for high fever, uncontrollable vomiting, or severely worsening symptoms.

## 2020-08-22 LAB — URINE CULTURE: Culture: 30000 — AB

## 2020-08-23 LAB — URINE CULTURE

## 2020-09-11 ENCOUNTER — Ambulatory Visit: Payer: PRIVATE HEALTH INSURANCE | Attending: Internal Medicine

## 2020-09-11 DIAGNOSIS — Z20822 Contact with and (suspected) exposure to covid-19: Secondary | ICD-10-CM

## 2020-09-12 LAB — NOVEL CORONAVIRUS, NAA: SARS-CoV-2, NAA: NOT DETECTED

## 2020-09-12 LAB — SARS-COV-2, NAA 2 DAY TAT

## 2020-09-18 ENCOUNTER — Ambulatory Visit: Payer: 59 | Attending: Critical Care Medicine

## 2020-09-18 ENCOUNTER — Other Ambulatory Visit: Payer: Self-pay

## 2020-09-18 DIAGNOSIS — Z20822 Contact with and (suspected) exposure to covid-19: Secondary | ICD-10-CM

## 2020-09-19 LAB — SARS-COV-2, NAA 2 DAY TAT

## 2020-09-19 LAB — NOVEL CORONAVIRUS, NAA: SARS-CoV-2, NAA: NOT DETECTED

## 2020-09-23 ENCOUNTER — Other Ambulatory Visit: Payer: PRIVATE HEALTH INSURANCE

## 2020-09-25 ENCOUNTER — Ambulatory Visit: Payer: 59 | Attending: Internal Medicine

## 2020-09-25 DIAGNOSIS — Z20822 Contact with and (suspected) exposure to covid-19: Secondary | ICD-10-CM

## 2020-09-26 LAB — SARS-COV-2, NAA 2 DAY TAT

## 2020-09-26 LAB — NOVEL CORONAVIRUS, NAA: SARS-CoV-2, NAA: NOT DETECTED

## 2020-10-01 ENCOUNTER — Ambulatory Visit (INDEPENDENT_AMBULATORY_CARE_PROVIDER_SITE_OTHER): Payer: 59 | Admitting: Psychology

## 2020-10-01 DIAGNOSIS — F4323 Adjustment disorder with mixed anxiety and depressed mood: Secondary | ICD-10-CM

## 2020-10-07 ENCOUNTER — Other Ambulatory Visit: Payer: 59

## 2020-10-09 ENCOUNTER — Other Ambulatory Visit: Payer: 59

## 2020-10-10 ENCOUNTER — Ambulatory Visit: Payer: 59 | Attending: Internal Medicine

## 2020-10-10 ENCOUNTER — Ambulatory Visit: Payer: 59 | Admitting: Psychology

## 2020-10-10 DIAGNOSIS — Z20822 Contact with and (suspected) exposure to covid-19: Secondary | ICD-10-CM

## 2020-10-11 LAB — NOVEL CORONAVIRUS, NAA: SARS-CoV-2, NAA: NOT DETECTED

## 2020-10-11 LAB — SARS-COV-2, NAA 2 DAY TAT

## 2020-10-14 ENCOUNTER — Ambulatory Visit: Payer: 59 | Admitting: Psychology

## 2020-10-16 ENCOUNTER — Other Ambulatory Visit: Payer: 59

## 2020-10-23 ENCOUNTER — Ambulatory Visit: Payer: 59 | Attending: Internal Medicine

## 2020-10-23 DIAGNOSIS — Z20822 Contact with and (suspected) exposure to covid-19: Secondary | ICD-10-CM

## 2020-10-24 LAB — NOVEL CORONAVIRUS, NAA: SARS-CoV-2, NAA: NOT DETECTED

## 2020-10-24 LAB — SARS-COV-2, NAA 2 DAY TAT

## 2020-11-10 ENCOUNTER — Ambulatory Visit: Payer: 59 | Attending: Critical Care Medicine

## 2020-11-10 DIAGNOSIS — Z20822 Contact with and (suspected) exposure to covid-19: Secondary | ICD-10-CM

## 2020-11-11 ENCOUNTER — Other Ambulatory Visit: Payer: 59

## 2020-11-11 LAB — NOVEL CORONAVIRUS, NAA: SARS-CoV-2, NAA: NOT DETECTED

## 2020-11-11 LAB — SARS-COV-2, NAA 2 DAY TAT

## 2020-11-20 ENCOUNTER — Ambulatory Visit: Payer: 59 | Attending: Internal Medicine

## 2020-11-20 DIAGNOSIS — Z20822 Contact with and (suspected) exposure to covid-19: Secondary | ICD-10-CM

## 2020-11-21 LAB — NOVEL CORONAVIRUS, NAA: SARS-CoV-2, NAA: NOT DETECTED

## 2020-11-21 LAB — SARS-COV-2, NAA 2 DAY TAT

## 2020-11-27 ENCOUNTER — Other Ambulatory Visit: Payer: 59

## 2021-04-16 ENCOUNTER — Institutional Professional Consult (permissible substitution): Payer: 59 | Admitting: Plastic Surgery

## 2021-04-30 ENCOUNTER — Institutional Professional Consult (permissible substitution): Payer: 59 | Admitting: Plastic Surgery

## 2021-05-06 ENCOUNTER — Emergency Department (HOSPITAL_BASED_OUTPATIENT_CLINIC_OR_DEPARTMENT_OTHER)
Admission: EM | Admit: 2021-05-06 | Discharge: 2021-05-06 | Disposition: A | Discharge status: Home / Self Care | Payer: 59 | Attending: Emergency Medicine | Admitting: Emergency Medicine

## 2021-05-06 ENCOUNTER — Other Ambulatory Visit: Payer: Self-pay

## 2021-05-06 ENCOUNTER — Encounter (HOSPITAL_BASED_OUTPATIENT_CLINIC_OR_DEPARTMENT_OTHER): Payer: Self-pay | Admitting: *Deleted

## 2021-05-06 ENCOUNTER — Emergency Department (HOSPITAL_BASED_OUTPATIENT_CLINIC_OR_DEPARTMENT_OTHER): Payer: 59

## 2021-05-06 DIAGNOSIS — Z87891 Personal history of nicotine dependence: Secondary | ICD-10-CM | POA: Diagnosis not present

## 2021-05-06 DIAGNOSIS — R112 Nausea with vomiting, unspecified: Secondary | ICD-10-CM | POA: Insufficient documentation

## 2021-05-06 DIAGNOSIS — R109 Unspecified abdominal pain: Secondary | ICD-10-CM | POA: Diagnosis present

## 2021-05-06 DIAGNOSIS — R1012 Left upper quadrant pain: Secondary | ICD-10-CM | POA: Insufficient documentation

## 2021-05-06 LAB — URINALYSIS, ROUTINE W REFLEX MICROSCOPIC
Bilirubin Urine: NEGATIVE
Glucose, UA: NEGATIVE mg/dL
Ketones, ur: NEGATIVE mg/dL
Leukocytes,Ua: NEGATIVE
Nitrite: NEGATIVE
Protein, ur: NEGATIVE mg/dL
Specific Gravity, Urine: 1.014 (ref 1.005–1.030)
pH: 5.5 (ref 5.0–8.0)

## 2021-05-06 LAB — COMPREHENSIVE METABOLIC PANEL
ALT: 11 U/L (ref 0–44)
AST: 12 U/L — ABNORMAL LOW (ref 15–41)
Albumin: 4.1 g/dL (ref 3.5–5.0)
Alkaline Phosphatase: 71 U/L (ref 38–126)
Anion gap: 8 (ref 5–15)
BUN: 11 mg/dL (ref 6–20)
CO2: 27 mmol/L (ref 22–32)
Calcium: 9.1 mg/dL (ref 8.9–10.3)
Chloride: 104 mmol/L (ref 98–111)
Creatinine, Ser: 0.76 mg/dL (ref 0.44–1.00)
GFR, Estimated: >60 mL/min (ref >60)
Glucose, Bld: 101 mg/dL — ABNORMAL HIGH (ref 70–99)
Potassium: 3.5 mmol/L (ref 3.5–5.1)
Sodium: 139 mmol/L (ref 135–145)
Total Bilirubin: 0.3 mg/dL (ref 0.3–1.2)
Total Protein: 7.1 g/dL (ref 6.5–8.1)

## 2021-05-06 LAB — CBC WITH DIFFERENTIAL/PLATELET
Abs Immature Granulocytes: 0.02 10*3/uL (ref 0.00–0.07)
Basophils Absolute: 0 10*3/uL (ref 0.0–0.1)
Basophils Relative: 1 %
Eosinophils Absolute: 0.1 10*3/uL (ref 0.0–0.5)
Eosinophils Relative: 2 %
HCT: 41.4 % (ref 36.0–46.0)
Hemoglobin: 13.2 g/dL (ref 12.0–15.0)
Immature Granulocytes: 0 %
Lymphocytes Relative: 29 %
Lymphs Abs: 2.3 10*3/uL (ref 0.7–4.0)
MCH: 27.2 pg (ref 26.0–34.0)
MCHC: 31.9 g/dL (ref 30.0–36.0)
MCV: 85.2 fL (ref 80.0–100.0)
Monocytes Absolute: 0.5 10*3/uL (ref 0.1–1.0)
Monocytes Relative: 6 %
Neutro Abs: 5.1 10*3/uL (ref 1.7–7.7)
Neutrophils Relative %: 62 %
Platelets: 411 10*3/uL — ABNORMAL HIGH (ref 150–400)
RBC: 4.86 MIL/uL (ref 3.87–5.11)
RDW: 13.8 % (ref 11.5–15.5)
WBC: 8.1 10*3/uL (ref 4.0–10.5)
nRBC: 0 % (ref 0.0–0.2)

## 2021-05-06 LAB — LIPASE, BLOOD: Lipase: 28 U/L (ref 11–51)

## 2021-05-06 LAB — PREGNANCY, URINE: Preg Test, Ur: NEGATIVE

## 2021-05-06 MED ORDER — DICYCLOMINE HCL 20 MG PO TABS: 20.0000 mg | ORAL_TABLET | Freq: Two times a day (BID) | ORAL | 0 refills | Status: DC

## 2021-05-06 MED ORDER — ONDANSETRON HCL 4 MG/2ML IJ SOLN
4.0000 mg | Freq: Once | INTRAMUSCULAR | Status: AC
  Administered 2021-05-06: 4 mg via INTRAVENOUS
  Filled 2021-05-06: qty 2

## 2021-05-06 MED ORDER — ONDANSETRON 4 MG PO TBDP: 4.0000 mg | ORAL_TABLET | Freq: Three times a day (TID) | ORAL | 0 refills | Status: AC | PRN

## 2021-05-06 MED ORDER — MORPHINE SULFATE (PF) 4 MG/ML IV SOLN
4.0000 mg | Freq: Once | INTRAVENOUS | Status: AC
  Administered 2021-05-06: 4 mg via INTRAVENOUS
  Filled 2021-05-06: qty 1

## 2021-05-06 MED ORDER — ONDANSETRON 4 MG PO TBDP: 4.0000 mg | ORAL_TABLET | Freq: Three times a day (TID) | ORAL | 0 refills | Status: DC | PRN

## 2021-05-06 MED ORDER — DICYCLOMINE HCL 20 MG PO TABS: 20.0000 mg | ORAL_TABLET | Freq: Two times a day (BID) | ORAL | 0 refills | Status: AC

## 2021-05-06 MED ORDER — SODIUM CHLORIDE 0.9 % IV BOLUS
1000.0000 mL | Freq: Once | INTRAVENOUS | Status: AC
  Administered 2021-05-06: 1000 mL via INTRAVENOUS

## 2021-05-06 NOTE — ED Triage Notes (Signed)
Went to urgent care near the [facility] in [place], having a lot of nausea and vomiting, states having a lot pain at left flank pain, also has noted blood in urine. Has taken Ibuprofen

## 2021-05-06 NOTE — ED Provider Notes (Signed)
[facility] Provider Note   CSN: [id number] Arrival date & time: 05/06/21  1306     History Chief Complaint  Patient presents with   Vomiting    Jillian Baker is a 45 y.o. female.  45 year old female presents today for evaluation of nausea, vomiting, and left flank pain since Thursday that worsened over the past few days.  Reports she was evaluated at urgent care 2 days ago and had renal ultrasound ordered which she has been unable to complete because of her inability to tolerate contrast.  She reports her pain is located in her left flank and left upper quadrant.  She denies dysuria, gross hematuria, vaginal discharge, lightheadedness, dyspnea.  She denies fever, or chills.  She denies previous history of kidney stone.  She states about 6 months ago she was referred over to urologist for microscopic hemoglobin in her urine and has had work-up that was reassuring.  The history is provided by the patient. No language interpreter was used.      Past Medical History:  Diagnosis Date   Restless leg     Patient Active Problem List   Diagnosis Date Noted   Vertigo 05/14/2013   Chest discomfort 05/14/2013   Nicotine dependence 05/14/2013    Past Surgical History:  Procedure Laterality Date   ABDOMINAL HYSTERECTOMY     CESAREAN SECTION     TUBAL LIGATION       OB History   No obstetric history on file.     Family History  Problem Relation Age of Onset   Asthma Mother    COPD Father    Pancreatic cancer Father    Breast cancer Paternal Aunt    Schizophrenia Paternal Aunt     Social History   Tobacco Use   Smoking status: Former    Types: Cigarettes    Quit date: 04/30/2018    Years since quitting: 3.0   Smokeless tobacco: Never  Vaping Use   Vaping Use: Never used  Substance Use Topics   Alcohol use: No   Drug use: No    Home Medications Prior to Admission medications   Medication Sig Start Date End Date Taking? Authorizing  Provider  ondansetron (ZOFRAN ODT) 4 MG disintegrating tablet Take 1 tablet (4 mg total) by mouth every 8 (eight) hours as needed for nausea or vomiting. 08/20/20   Robinson, [place] N, PA-C  sertraline (ZOLOFT) 100 MG tablet Take 100 mg by mouth daily.    [provider]  tizanidine (ZANAFLEX) 2 MG capsule Take 2 mg by mouth 3 (three) times daily.    [provider]  traMADol (ULTRAM) 50 MG tablet Take by mouth every 6 (six) hours as needed.    [provider]    Allergies    Codeine, Ivp dye [iodinated diagnostic agents], Percocet [oxycodone-acetaminophen], and Shellfish allergy  Review of Systems   Review of Systems  Constitutional:  Negative for activity change, chills and fever.  Respiratory:  Negative for shortness of breath.   Cardiovascular:  Negative for chest pain.  Gastrointestinal:  Positive for abdominal pain, nausea and vomiting.  Genitourinary:  Positive for flank pain.  All other systems reviewed and are negative.  Physical Exam Updated Vital Signs BP (!) 146/96 (BP Location: Left Arm)   Pulse 88   Temp 99.5 F (37.5 C) (Oral)   Resp 18   Ht 5' (1.524 m)   Wt 94.3 kg   LMP 02/16/2015   SpO2 100%   BMI 40.62  kg/m   Physical Exam Vitals and nursing note reviewed.  Constitutional:      General: She is not in acute distress.    Appearance: Normal appearance. She is not ill-appearing.  HENT:     Head: Normocephalic and atraumatic.     Nose: Nose normal.  Eyes:     General: No scleral icterus.    Extraocular Movements: Extraocular movements intact.     Conjunctiva/sclera: Conjunctivae normal.  Cardiovascular:     Rate and Rhythm: Normal rate and regular rhythm.     Pulses: Normal pulses.     Heart sounds: Normal heart sounds.  Pulmonary:     Effort: Pulmonary effort is normal. No respiratory distress.     Breath sounds: Normal breath sounds. No wheezing or rales.  Abdominal:     General: There is no distension.     Tenderness:  There is abdominal tenderness. There is left CVA tenderness. There is no right CVA tenderness or guarding.  Musculoskeletal:        General: Normal range of motion.     Cervical back: Normal range of motion.  Skin:    General: Skin is warm and dry.  Neurological:     General: No focal deficit present.     Mental Status: She is alert. Mental status is at baseline.    ED Results / Procedures / Treatments   Labs (all labs ordered are listed, but only abnormal results are displayed) Labs Reviewed  CBC WITH DIFFERENTIAL/PLATELET - Abnormal; Notable for the following components:      Result Value   Platelets 411 (*)    All other components within normal limits  COMPREHENSIVE METABOLIC PANEL - Abnormal; Notable for the following components:   Glucose, Bld 101 (*)    AST 12 (*)    All other components within normal limits  URINALYSIS, ROUTINE W REFLEX MICROSCOPIC - Abnormal; Notable for the following components:   Hgb urine dipstick SMALL (*)    Bacteria, UA RARE (*)    All other components within normal limits  PREGNANCY, URINE  LIPASE, BLOOD    EKG None  Radiology No results found.  Procedures Procedures   Medications Ordered in ED Medications  sodium chloride 0.9 % bolus 1,000 mL (1,000 mLs Intravenous New Bag/Given 05/06/21 1418)    ED Course  I have reviewed the triage vital signs and the nursing notes.  Pertinent labs & imaging results that were available during my care of the patient were reviewed by me and considered in my medical decision making (see chart for details).    MDM Rules/Calculators/A&P                           45 year old female presents today for evaluation of left flank pain, left upper quadrant abdominal pain, nausea vomiting started Thursday and worsened couple days ago.  Blood work ordered, CT renal study ordered, and patient given morphine and Zofran.  On reevaluation patient resting comfortably without acute distress.  Continues to have  left-sided flank pain.  Patient remained without episode of vomiting throughout her stay here.  Patient's blood work is significant for CBC without leukocytosis, or anemia, CMP with glucose of 101 otherwise unremarkable, UA without signs of UTI.  Does have small amounts of hemoglobin, per patient that is her baseline.  CT renal without findings of kidney stone or other acute intra-abdominal process.  Findings discussed with patient.  Patient is appropriate for discharge.  Will  discharge patient with Bentyl and Zofran discussed importance of follow-up with PCP.  Return precautions discussed.  She voices understanding and is in agreement with plan.    Final Clinical Impression(s) / ED Diagnoses Final diagnoses:  Abdominal pain, unspecified abdominal location    Rx / DC Orders ED Discharge Orders     None        Evlyn Courier, PA-C 05/06/21 [place], [place], DO 05/08/21 [phone]

## 2021-05-06 NOTE — Discharge Instructions (Addendum)
Your work-up today including blood work, CT scan, urine were all reassuring.  I am unsure where your symptoms are coming from.  I have sent in Bentyl and Zofran for you to take as needed for nausea or abdominal pain.  I recommend you follow-up with your primary care provider for further work-up.  If your pain worsens, you develop fever, you develop nausea and vomiting to the point you are unable to tolerate any food or drink despite taking Zofran please return to the emergency room.

## 2021-06-26 ENCOUNTER — Emergency Department (HOSPITAL_COMMUNITY): Payer: 59

## 2021-06-26 ENCOUNTER — Emergency Department (HOSPITAL_COMMUNITY)
Admission: EM | Admit: 2021-06-26 | Discharge: 2021-06-27 | Disposition: A | Discharge status: Home / Self Care | Payer: 59 | Attending: Emergency Medicine | Admitting: Emergency Medicine

## 2021-06-26 ENCOUNTER — Other Ambulatory Visit: Payer: Self-pay

## 2021-06-26 DIAGNOSIS — M7989 Other specified soft tissue disorders: Secondary | ICD-10-CM | POA: Diagnosis not present

## 2021-06-26 DIAGNOSIS — R519 Headache, unspecified: Secondary | ICD-10-CM | POA: Diagnosis not present

## 2021-06-26 DIAGNOSIS — R42 Dizziness and giddiness: Secondary | ICD-10-CM | POA: Diagnosis not present

## 2021-06-26 DIAGNOSIS — J45909 Unspecified asthma, uncomplicated: Secondary | ICD-10-CM | POA: Diagnosis not present

## 2021-06-26 DIAGNOSIS — R10812 Left upper quadrant abdominal tenderness: Secondary | ICD-10-CM

## 2021-06-26 DIAGNOSIS — R5383 Other fatigue: Secondary | ICD-10-CM | POA: Insufficient documentation

## 2021-06-26 DIAGNOSIS — R0602 Shortness of breath: Secondary | ICD-10-CM

## 2021-06-26 LAB — CBC WITH DIFFERENTIAL/PLATELET
Abs Immature Granulocytes: 0.03 10*3/uL (ref 0.00–0.07)
Basophils Absolute: 0 10*3/uL (ref 0.0–0.1)
Basophils Relative: 0 %
Eosinophils Absolute: 0.1 10*3/uL (ref 0.0–0.5)
Eosinophils Relative: 2 %
HCT: 41.9 % (ref 36.0–46.0)
Hemoglobin: 13.7 g/dL (ref 12.0–15.0)
Immature Granulocytes: 0 %
Lymphocytes Relative: 28 %
Lymphs Abs: 2.2 10*3/uL (ref 0.7–4.0)
MCH: 28.4 pg (ref 26.0–34.0)
MCHC: 32.7 g/dL (ref 30.0–36.0)
MCV: 86.9 fL (ref 80.0–100.0)
Monocytes Absolute: 0.6 10*3/uL (ref 0.1–1.0)
Monocytes Relative: 7 %
Neutro Abs: 4.9 10*3/uL (ref 1.7–7.7)
Neutrophils Relative %: 63 %
Platelets: 403 10*3/uL — ABNORMAL HIGH (ref 150–400)
RBC: 4.82 MIL/uL (ref 3.87–5.11)
RDW: 13.5 % (ref 11.5–15.5)
WBC: 7.8 10*3/uL (ref 4.0–10.5)
nRBC: 0 % (ref 0.0–0.2)

## 2021-06-26 LAB — BASIC METABOLIC PANEL
Anion gap: 7 (ref 5–15)
BUN: 11 mg/dL (ref 6–20)
CO2: 26 mmol/L (ref 22–32)
Calcium: 8.9 mg/dL (ref 8.9–10.3)
Chloride: 106 mmol/L (ref 98–111)
Creatinine, Ser: 0.75 mg/dL (ref 0.44–1.00)
GFR, Estimated: >60 mL/min (ref >60)
Glucose, Bld: 84 mg/dL (ref 70–99)
Potassium: 3.9 mmol/L (ref 3.5–5.1)
Sodium: 139 mmol/L (ref 135–145)

## 2021-06-26 LAB — TROPONIN I (HIGH SENSITIVITY): Troponin I (High Sensitivity): <2 ng/L (ref <18)

## 2021-06-26 MED ORDER — IOHEXOL 350 MG/ML SOLN
100.0000 mL | Freq: Once | INTRAVENOUS | Status: AC | PRN
  Administered 2021-06-26: 100 mL via INTRAVENOUS

## 2021-06-26 MED ORDER — HYDROCORTISONE SOD SUC (PF) 100 MG IJ SOLR
200.0000 mg | Freq: Once | INTRAMUSCULAR | Status: AC
  Administered 2021-06-26: 200 mg via INTRAVENOUS
  Filled 2021-06-26: qty 4

## 2021-06-26 MED ORDER — FENTANYL CITRATE PF 50 MCG/ML IJ SOSY
100.0000 ug | PREFILLED_SYRINGE | Freq: Once | INTRAMUSCULAR | Status: AC
  Administered 2021-06-26: 100 ug via INTRAVENOUS
  Filled 2021-06-26: qty 2

## 2021-06-26 MED ORDER — DIPHENHYDRAMINE HCL 50 MG/ML IJ SOLN
50.0000 mg | Freq: Once | INTRAMUSCULAR | Status: AC
  Administered 2021-06-26: 50 mg via INTRAVENOUS
  Filled 2021-06-26: qty 1

## 2021-06-26 MED ORDER — DIPHENHYDRAMINE HCL 25 MG PO CAPS: 50.0000 mg | ORAL_CAPSULE | Freq: Once | ORAL | Status: AC

## 2021-06-26 NOTE — ED Notes (Addendum)
Unable to Obtain labs on pt
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
## 2021-06-26 NOTE — ED Notes (Signed)
CT made aware of benedryl administration for contrast sensitivity.

## 2021-06-26 NOTE — ED Triage Notes (Signed)
Pt from [facility] Medicine w [facility] of [facility]10. Seen for DVT study in R calf Wednesday, negative. Seen for [facility] today, labwork done & sent to ED after result called. Pt in NAD in triage, c/o SHOB, HA & R calf pain

## 2021-06-26 NOTE — ED Provider Triage Note (Signed)
Emergency Medicine Provider Triage Evaluation Note  Sharlyne Koeneman , a 46 y.o. female  was evaluated in triage.  Pt complains of shortness of breath.  Had elevated D-dimer in the outpatient setting.  Had a negative right leg Doppler.  Does have a contrast allergy.  Sent here for rule out pulmonary embolism.  Review of Systems  Positive:  Negative: See above   Physical Exam  BP (!) 134/91 (BP Location: Right Arm)    Pulse 84    Temp 98.5 F (36.9 C) (Oral)    Resp 16    LMP 02/16/2015    SpO2 95%  Gen:   Awake, no distress   Resp:  Normal effort  MSK:   Moves extremities without difficulty  Other:    Medical Decision Making  Medically screening exam initiated at 4:47 PM.  Appropriate orders placed.  Lupita Rosales was informed that the remainder of the evaluation will be completed by another provider, this initial triage assessment does not replace that evaluation, and the importance of remaining in the ED until their evaluation is complete.  VQ scan ordered.   Myna Bright [place], [place] 06/26/21 1649

## 2021-06-26 NOTE — ED Provider Notes (Signed)
11:32 PM Assumed care from Dr. Billy Fischer, please see their note for full history, physical and decision making until this point. In brief this is a 46 y.o. year old female who presented to the ED tonight with Shortness of Breath and Headache     Pending PE scan. Can go home. Also pending abdomen.   CT scans read by radiology. Normal. Patient stable. Feels ok. Stable for dc. Outpatient repeat US ordered.   Discharge instructions, including strict return precautions for new or worsening symptoms, given. Patient and/or family verbalized understanding and agreement with the plan as described.   Labs, studies and imaging reviewed by myself and considered in medical decision making if ordered. Imaging interpreted by radiology.  Labs Reviewed  CBC WITH DIFFERENTIAL/PLATELET - Abnormal; Notable for the following components:      Result Value   Platelets 403 (*)    All other components within normal limits  BASIC METABOLIC PANEL  TROPONIN I (HIGH SENSITIVITY)  TROPONIN I (HIGH SENSITIVITY)    DG Chest 2 View  Final Result    NM PULMONARY VENT AND PERF (V/Q Scan)    (Results Pending)  CT Angio Chest PE W and/or Wo Contrast    (Results Pending)  CT ABDOMEN PELVIS W CONTRAST    (Results Pending)    No follow-ups on file.    Jillian Baker, Corene Cornea, MD 06/27/21 [phone]

## 2021-06-27 LAB — TROPONIN I (HIGH SENSITIVITY): Troponin I (High Sensitivity): 3 ng/L (ref <18)

## 2021-06-27 NOTE — ED Notes (Signed)
E-signature pad unavailable at time of pt discharge. This RN discussed discharge materials with pt and answered all pt questions. Pt stated understanding of discharge material. ? ?

## 2021-06-29 ENCOUNTER — Other Ambulatory Visit: Payer: Self-pay

## 2021-06-29 ENCOUNTER — Ambulatory Visit (HOSPITAL_COMMUNITY)
Admission: RE | Admit: 2021-06-29 | Discharge: 2021-06-29 | Disposition: A | Discharge status: Home / Self Care | Payer: 59 | Source: Ambulatory Visit | Attending: Emergency Medicine | Admitting: Emergency Medicine

## 2021-06-29 DIAGNOSIS — M79669 Pain in unspecified lower leg: Secondary | ICD-10-CM

## 2021-06-29 DIAGNOSIS — M79604 Pain in right leg: Secondary | ICD-10-CM | POA: Insufficient documentation

## 2021-06-29 DIAGNOSIS — M79605 Pain in left leg: Secondary | ICD-10-CM | POA: Diagnosis present

## 2021-06-29 NOTE — Progress Notes (Signed)
Lower extremity venous has been completed.   Preliminary results in CV Proc.   Jillian Baker 06/29/2021 11:24 AM

## 2022-02-09 ENCOUNTER — Other Ambulatory Visit: Payer: Self-pay | Admitting: Nephrology

## 2022-02-09 DIAGNOSIS — I10 Essential (primary) hypertension: Secondary | ICD-10-CM

## 2022-02-09 DIAGNOSIS — L732 Hidradenitis suppurativa: Secondary | ICD-10-CM

## 2022-02-09 DIAGNOSIS — R319 Hematuria, unspecified: Secondary | ICD-10-CM

## 2022-02-12 ENCOUNTER — Ambulatory Visit
Admission: RE | Admit: 2022-02-12 | Discharge: 2022-02-12 | Disposition: A | Discharge status: Home / Self Care | Payer: 59 | Source: Ambulatory Visit | Attending: Nephrology | Admitting: Nephrology

## 2022-02-12 DIAGNOSIS — R319 Hematuria, unspecified: Secondary | ICD-10-CM

## 2022-02-12 DIAGNOSIS — I10 Essential (primary) hypertension: Secondary | ICD-10-CM

## 2022-02-12 DIAGNOSIS — L732 Hidradenitis suppurativa: Secondary | ICD-10-CM

## 2022-03-01 ENCOUNTER — Emergency Department (HOSPITAL_BASED_OUTPATIENT_CLINIC_OR_DEPARTMENT_OTHER): Payer: 59 | Admitting: Radiology

## 2022-03-01 ENCOUNTER — Emergency Department (HOSPITAL_BASED_OUTPATIENT_CLINIC_OR_DEPARTMENT_OTHER): Payer: 59

## 2022-03-01 ENCOUNTER — Emergency Department (HOSPITAL_BASED_OUTPATIENT_CLINIC_OR_DEPARTMENT_OTHER)
Admission: EM | Admit: 2022-03-01 | Discharge: 2022-03-02 | Disposition: A | Discharge status: Home / Self Care | Payer: 59 | Attending: Emergency Medicine | Admitting: Emergency Medicine

## 2022-03-01 ENCOUNTER — Encounter (HOSPITAL_BASED_OUTPATIENT_CLINIC_OR_DEPARTMENT_OTHER): Payer: Self-pay | Admitting: Emergency Medicine

## 2022-03-01 ENCOUNTER — Other Ambulatory Visit: Payer: Self-pay

## 2022-03-01 DIAGNOSIS — Z87891 Personal history of nicotine dependence: Secondary | ICD-10-CM | POA: Diagnosis not present

## 2022-03-01 DIAGNOSIS — M79662 Pain in left lower leg: Secondary | ICD-10-CM | POA: Insufficient documentation

## 2022-03-01 DIAGNOSIS — R079 Chest pain, unspecified: Secondary | ICD-10-CM | POA: Insufficient documentation

## 2022-03-01 DIAGNOSIS — R0602 Shortness of breath: Secondary | ICD-10-CM | POA: Insufficient documentation

## 2022-03-01 DIAGNOSIS — M79605 Pain in left leg: Secondary | ICD-10-CM

## 2022-03-01 DIAGNOSIS — Z85828 Personal history of other malignant neoplasm of skin: Secondary | ICD-10-CM | POA: Diagnosis not present

## 2022-03-01 LAB — CBC
HCT: 42 % (ref 36.0–46.0)
Hemoglobin: 13.6 g/dL (ref 12.0–15.0)
MCH: 28.2 pg (ref 26.0–34.0)
MCHC: 32.4 g/dL (ref 30.0–36.0)
MCV: 87.1 fL (ref 80.0–100.0)
Platelets: 408 10*3/uL — ABNORMAL HIGH (ref 150–400)
RBC: 4.82 MIL/uL (ref 3.87–5.11)
RDW: 14.3 % (ref 11.5–15.5)
WBC: 10.7 10*3/uL — ABNORMAL HIGH (ref 4.0–10.5)
nRBC: 0 % (ref 0.0–0.2)

## 2022-03-01 LAB — BASIC METABOLIC PANEL
Anion gap: 8 (ref 5–15)
BUN: 10 mg/dL (ref 6–20)
CO2: 25 mmol/L (ref 22–32)
Calcium: 9.2 mg/dL (ref 8.9–10.3)
Chloride: 103 mmol/L (ref 98–111)
Creatinine, Ser: 0.73 mg/dL (ref 0.44–1.00)
GFR, Estimated: >60 mL/min (ref >60)
Glucose, Bld: 78 mg/dL (ref 70–99)
Potassium: 3.9 mmol/L (ref 3.5–5.1)
Sodium: 136 mmol/L (ref 135–145)

## 2022-03-01 LAB — D-DIMER, QUANTITATIVE: D-Dimer, Quant: 1.16 ug/mL-FEU — ABNORMAL HIGH (ref 0.00–0.50)

## 2022-03-01 LAB — TROPONIN I (HIGH SENSITIVITY)
Troponin I (High Sensitivity): <2 ng/L (ref <18)
Troponin I (High Sensitivity): <2 ng/L (ref <18)

## 2022-03-01 LAB — BRAIN NATRIURETIC PEPTIDE: B Natriuretic Peptide: 27.2 pg/mL (ref 0.0–100.0)

## 2022-03-01 MED ORDER — DIPHENHYDRAMINE HCL 25 MG PO CAPS: 50.0000 mg | ORAL_CAPSULE | Freq: Once | ORAL | Status: DC

## 2022-03-01 MED ORDER — DIPHENHYDRAMINE HCL 50 MG/ML IJ SOLN
50.0000 mg | Freq: Once | INTRAMUSCULAR | Status: AC | PRN
  Administered 2022-03-01: 50 mg via INTRAVENOUS
  Filled 2022-03-01: qty 1

## 2022-03-01 MED ORDER — DIPHENHYDRAMINE HCL 50 MG/ML IJ SOLN: 50.0000 mg | Freq: Once | INTRAMUSCULAR | Status: DC

## 2022-03-01 MED ORDER — METHYLPREDNISOLONE SODIUM SUCC 40 MG IJ SOLR
40.0000 mg | Freq: Once | INTRAMUSCULAR | Status: AC
  Administered 2022-03-01: 40 mg via INTRAVENOUS
  Filled 2022-03-01: qty 1

## 2022-03-01 MED ORDER — IOHEXOL 350 MG/ML SOLN
75.0000 mL | Freq: Once | INTRAVENOUS | Status: AC | PRN
  Administered 2022-03-02: 75 mL via INTRAVENOUS

## 2022-03-01 MED ORDER — LORAZEPAM 1 MG PO TABS
0.5000 mg | ORAL_TABLET | Freq: Once | ORAL | Status: AC
  Administered 2022-03-01: 0.5 mg via ORAL
  Filled 2022-03-01: qty 1

## 2022-03-01 MED ORDER — DIPHENHYDRAMINE HCL 25 MG PO CAPS: 50.0000 mg | ORAL_CAPSULE | Freq: Once | ORAL | Status: AC | PRN

## 2022-03-01 NOTE — ED Notes (Signed)
Pt ambulatory to bathroom with standby assistance 

## 2022-03-01 NOTE — ED Notes (Signed)
Pt has called out for this nurse -- upon entering room pt reports "I want to go home" - pt goes on to express that she is claustrophic and is anxious-- pt then became tearful.  Educated pt on plan for CT which pre-medication has been started for and risks of leaving AMA could be detrimental to health and safety.  Advised pt would speak to provider and requests anxiety med - pt reports that she takes Zoloft, Buspar and Wellbutrin at home for mental health issues.

## 2022-03-01 NOTE — ED Notes (Signed)
ED Provider at bedside. 

## 2022-03-01 NOTE — ED Provider Notes (Addendum)
[facility] EMERGENCY DEPT Provider Note  CSN: [id number] Arrival date & time: 03/01/22 1743  Chief Complaint(s) Shortness of Breath  HPI Jillian Baker is a 46 y.o. female without significant past medical history presenting to the emergency department with left leg pain.  She reports left leg pain for 2 days.  Mostly located in the calf.  She also reports today she developed pleuritic chest pain and mild shortness of breath.  Both of these are worse with exertion.  No fevers or chills, cough.  No sore throat or runny nose.  No nausea or vomiting, diaphoresis.  No syncope.  Denies any recent travel or surgeries.  Denies history of blood clots.   Past Medical History Past Medical History:  Diagnosis Date   Cancer [facility])    Restless leg    Patient Active Problem List   Diagnosis Date Noted   Vertigo 05/14/2013   Chest discomfort 05/14/2013   Nicotine dependence 05/14/2013   Home Medication(s) Prior to Admission medications   Medication Sig Start Date End Date Taking? Authorizing Provider  buPROPion (WELLBUTRIN XL) 300 MG 24 hr tablet Take 1 tablet by mouth daily. 04/26/21   [provider]  dicyclomine (BENTYL) 20 MG tablet Take 1 tablet (20 mg total) by mouth 2 (two) times daily. 05/06/21   Deatra Canter, Amjad, PA-C  HUMIRA PEN 40 MG/0.4ML PNKT SMARTSIG:40 Milligram(s) SUB-Q Once a Week 06/25/21   [provider]  ondansetron (ZOFRAN-ODT) 4 MG disintegrating tablet Take 1 tablet (4 mg total) by mouth every 8 (eight) hours as needed for nausea or vomiting. 05/06/21   Evlyn Courier, PA-C  sertraline (ZOLOFT) 100 MG tablet Take 100 mg by mouth daily.    [provider]  tizanidine (ZANAFLEX) 2 MG capsule Take 2 mg by mouth 3 (three) times daily.    [provider]  traMADol (ULTRAM) 50 MG tablet Take 50 mg by mouth every 6 (six) hours as needed for moderate pain.    [provider]                                                                                                                                     Past Surgical History Past Surgical History:  Procedure Laterality Date   ABDOMINAL HYSTERECTOMY     CESAREAN SECTION     TUBAL LIGATION     Family History Family History  Problem Relation Age of Onset   Asthma Mother    COPD Father    Pancreatic cancer Father    Breast cancer Paternal Aunt    Schizophrenia Paternal Aunt     Social History Social History   Tobacco Use   Smoking status: Former    Types: Cigarettes    Quit date: 04/30/2018    Years since quitting: 3.8   Smokeless tobacco: Never  Vaping Use   Vaping Use: Never used  Substance Use Topics   Alcohol use: No  Drug use: No   Allergies Codeine, Ivp dye [iodinated contrast media], Percocet [oxycodone-acetaminophen], and Shellfish allergy  Review of Systems Review of Systems  All other systems reviewed and are negative.   Physical Exam Vital Signs  I have reviewed the triage vital signs BP 101/89   Pulse 83   Temp 98.4 F (36.9 C) (Oral)   Resp 19   Ht 5' (1.524 m)   Wt 102.5 kg   LMP 02/16/2015   SpO2 100%   BMI 44.14 kg/m  Physical Exam Vitals and nursing note reviewed.  Constitutional:      General: She is not in acute distress.    Appearance: She is well-developed.  HENT:     Head: Normocephalic and atraumatic.     Mouth/Throat:     Mouth: Mucous membranes are moist.  Eyes:     Pupils: Pupils are equal, round, and reactive to light.  Cardiovascular:     Rate and Rhythm: Normal rate and regular rhythm.     Heart sounds: No murmur heard. Pulmonary:     Effort: Pulmonary effort is normal. No respiratory distress.     Breath sounds: Normal breath sounds.  Abdominal:     General: Abdomen is flat.     Palpations: Abdomen is soft.     Tenderness: There is no abdominal tenderness.  Musculoskeletal:     Right lower leg: No edema.     Left lower leg: Tenderness present. No edema.  Skin:    General: Skin is warm and dry.   Neurological:     General: No focal deficit present.     Mental Status: She is alert. Mental status is at baseline.  Psychiatric:        Mood and Affect: Mood normal.        Behavior: Behavior normal.     ED Results and Treatments Labs (all labs ordered are listed, but only abnormal results are displayed) Labs Reviewed  CBC - Abnormal; Notable for the following components:      Result Value   WBC 10.7 (*)    Platelets 408 (*)    All other components within normal limits  D-DIMER, QUANTITATIVE - Abnormal; Notable for the following components:   D-Dimer, Quant 1.16 (*)    All other components within normal limits  BASIC METABOLIC PANEL  BRAIN NATRIURETIC PEPTIDE  TROPONIN I (HIGH SENSITIVITY)  TROPONIN I (HIGH SENSITIVITY)                                                                                                                          Radiology US Venous Img Lower  Left (DVT Study)  Result Date: 03/01/2022 CLINICAL DATA:  leg pain EXAM: Left LOWER EXTREMITY VENOUS DOPPLER ULTRASOUND TECHNIQUE: Gray-scale sonography with compression, as well as color and duplex ultrasound, were performed to evaluate the deep venous system(s) from the level of the common femoral vein through the popliteal and proximal calf veins. COMPARISON:  None Available. FINDINGS: VENOUS  Normal compressibility of the common femoral, superficial femoral, and popliteal veins, as well as the visualized calf veins. Visualized portions of profunda femoral vein and great saphenous vein unremarkable. No filling defects to suggest DVT on grayscale or color Doppler imaging. Doppler waveforms show normal direction of venous flow, normal respiratory plasticity and response to augmentation. Limited views of the contralateral common femoral vein are unremarkable. OTHER None. Limitations: none IMPRESSION: No deep venous thrombosis of the left lower extremity. Electronically Signed   By: Iven Finn M.D.   On: 03/01/2022  20:40   DG Chest 2 View  Result Date: 03/01/2022 CLINICAL DATA:  Shortness of breath and chest tightness. EXAM: CHEST - 2 VIEW COMPARISON:  June 26, 2021 FINDINGS: The heart size and mediastinal contours are within normal limits. Both lungs are clear. The visualized skeletal structures are unremarkable. IMPRESSION: No active cardiopulmonary disease. Electronically Signed   By: Virgina Norfolk M.D.   On: 03/01/2022 18:27    Pertinent labs & imaging results that were available during my care of the patient were reviewed by me and considered in my medical decision making (see MDM for details).  Medications Ordered in ED Medications  diphenhydrAMINE (BENADRYL) capsule 50 mg (has no administration in time range)    Or  diphenhydrAMINE (BENADRYL) injection 50 mg (has no administration in time range)  methylPREDNISolone sodium succinate (SOLU-MEDROL) 40 mg/mL injection 40 mg (40 mg Intravenous Given 03/01/22 2010)  LORazepam (ATIVAN) tablet 0.5 mg (0.5 mg Oral Given 03/01/22 2156)                                                                                                                                     Procedures Procedures  (including critical care time)  Medical Decision Making / ED Course   MDM:  46 year old female presenting to the emergency department with leg pain and chest pain.  Symptoms somewhat concerning for pulmonary embolism and/or DVT.  Will obtain ultrasound.  D-dimer is elevated.  Patient has contrast allergy so will be premedicated.  Unclear cause of leg pain if ultrasound negative, doubt fracture, patient has been ambulatory no trauma, no evidence of cellulitis.  Chest CT scan will also show alternative causes of chest pain such as pneumothorax, pneumonia if patient has these but low concern given symptoms.  Doubt ACS given age and EKG with negative troponin.  Will reassess.  Clinical Course as of 03/01/22 2326  Mon Mar 01, 2022  2250 Care transitioned to  oncoming provider pending CT scan after premedication with positive D-dimer. If negative likely discharge. Low concern for ACS. [place] leg reassuring. [WS]    Clinical Course User Index [WS] Cristie Hem, MD     Additional history obtained: -External records from outside source obtained and reviewed including: Chart review including previous notes, labs, imaging, consultation notes   Lab Tests: -I ordered, reviewed, and interpreted labs.   The pertinent results include:  Labs Reviewed  CBC - Abnormal; Notable for the following components:      Result Value   WBC 10.7 (*)    Platelets 408 (*)    All other components within normal limits  D-DIMER, QUANTITATIVE - Abnormal; Notable for the following components:   D-Dimer, Quant 1.16 (*)    All other components within normal limits  BASIC METABOLIC PANEL  BRAIN NATRIURETIC PEPTIDE  TROPONIN I (HIGH SENSITIVITY)  TROPONIN I (HIGH SENSITIVITY)      EKG   EKG Interpretation  Date/Time:  Monday March 01 2022 17:54:12 EDT Ventricular Rate:  91 PR Interval:  138 QRS Duration: 80 QT Interval:  344 QTC Calculation: 423 R Axis:   -12 Text Interpretation: Normal sinus rhythm Possible Anterior infarct , age undetermined Abnormal ECG When compared with ECG of 26-Jun-2021 18:32, PREVIOUS ECG IS PRESENT Confirmed by Garnette Gunner [phone]) on 03/01/2022 7:29:04 PM         Imaging Studies ordered: I ordered imaging studies including [place] LLE On my interpretation imaging demonstrates no DVT I independently visualized and interpreted imaging. I agree with the radiologist interpretation   Medicines ordered and prescription drug management: Meds ordered this encounter  Medications   methylPREDNISolone sodium succinate (SOLU-MEDROL) 40 mg/mL injection 40 mg    IV methylprednisolone will be converted to either a q12h or q24h frequency with the same total daily dose (TDD).  Ordered Dose: 1 to 125 mg TDD; convert to: TDD q24h.   Ordered Dose: 126 to 250 mg TDD; convert to: TDD div q12h.  Ordered Dose: >250 mg TDD; DAW.   DISCONTD: diphenhydrAMINE (BENADRYL) capsule 50 mg   DISCONTD: diphenhydrAMINE (BENADRYL) injection 50 mg   DISCONTD: diphenhydrAMINE (BENADRYL) capsule 50 mg   DISCONTD: diphenhydrAMINE (BENADRYL) injection 50 mg   LORazepam (ATIVAN) tablet 0.5 mg   OR Linked Order Group    diphenhydrAMINE (BENADRYL) capsule 50 mg    diphenhydrAMINE (BENADRYL) injection 50 mg    -I have reviewed the patients home medicines and have made adjustments as needed   Cardiac Monitoring: The patient was maintained on a cardiac monitor.  I personally viewed and interpreted the cardiac monitored which showed an underlying rhythm of: NSR  Social Determinants of Health:  Factors impacting patients care include: former smoker   Reevaluation: After the interventions noted above, I reevaluated the patient and found that they have improved  Co morbidities that complicate the patient evaluation  Past Medical History:  Diagnosis Date   Cancer ([place])    Restless leg       Dispostion: Discharge    Final Clinical Impression(s) / ED Diagnoses Final diagnoses:  Chest pain, unspecified type  Left leg pain     This chart was dictated using voice recognition software.  Despite best efforts to proofread,  errors can occur which can change the documentation meaning.    Cristie Hem, MD 03/01/22 2253    Cristie Hem, MD 03/01/22 2326

## 2022-03-01 NOTE — ED Notes (Addendum)
Ultrasound now in progress- CT dept notified of Solu-medrol administration '@2010'$ - plan to administer Benadryl 2310hrs and CT will scan 0010hrs.  Continuous cardiac and pulse ox monitoring initiated; while placing pt on monitor pt reports "I dont really need all of this" educated pt on the importance of cardiac/pulse ox continuous monitoring; pt acknowledges verbal understanding and is agreeable to remain on monitoring equipment- will monitor for acute changes and maintain plan of care.

## 2022-03-01 NOTE — ED Triage Notes (Signed)
Pt arrives to ED with c/o shortness of breath, chest tightness, and left leg swelling. She endorses leg pain x2 days and SOB that gradually started today.

## 2022-03-01 NOTE — ED Notes (Addendum)
Dr. Truett Mainland reports via secure chat he will go in to speak with patient

## 2022-03-02 ENCOUNTER — Emergency Department (HOSPITAL_BASED_OUTPATIENT_CLINIC_OR_DEPARTMENT_OTHER): Payer: 59

## 2022-03-02 MED ORDER — NAPROXEN 500 MG PO TABS: 500.0000 mg | ORAL_TABLET | Freq: Two times a day (BID) | ORAL | 0 refills | Status: AC

## 2022-03-02 NOTE — ED Provider Notes (Signed)
Care of the patient assumed at the change of shift. Here for leg pain and SOB, doppler was neg. Awaiting CTA after pre-treatment for contrast allergy which was neg for PE or other acute process. She is otherwise doing well and ready to go home. Rx for Naprosyn for pain. PCP follow up.    Truddie Hidden, MD 03/02/22 [phone]

## 2022-03-02 NOTE — ED Notes (Signed)
Late entry -- Pt has returned from [place] via stretcher - awaiting results at this time

## 2022-03-02 NOTE — ED Notes (Signed)
ED Provider at bedside. 

## 2022-03-02 NOTE — ED Notes (Signed)
Pt agreeable with d/c plan as discussed by provider - this nurse has verbally reinforced d/c instructions and provided pt with written copy -pt acknowledges verbal understanding and denies any addl questions, concerns, needs - escorted to exit w/c by daughters who will accompany pt home.

## 2022-03-02 NOTE — ED Notes (Signed)
Patient transported to CT via stretcher at this time after ambulating again to and from bathroom with assistance of 2 daughters

## 2022-03-25 ENCOUNTER — Emergency Department (HOSPITAL_BASED_OUTPATIENT_CLINIC_OR_DEPARTMENT_OTHER)
Admission: EM | Admit: 2022-03-25 | Discharge: 2022-03-25 | Disposition: A | Discharge status: Home / Self Care | Payer: 59 | Attending: Emergency Medicine | Admitting: Emergency Medicine

## 2022-03-25 ENCOUNTER — Encounter (HOSPITAL_BASED_OUTPATIENT_CLINIC_OR_DEPARTMENT_OTHER): Payer: Self-pay

## 2022-03-25 ENCOUNTER — Emergency Department (HOSPITAL_BASED_OUTPATIENT_CLINIC_OR_DEPARTMENT_OTHER): Payer: 59

## 2022-03-25 ENCOUNTER — Other Ambulatory Visit: Payer: Self-pay

## 2022-03-25 DIAGNOSIS — R5383 Other fatigue: Secondary | ICD-10-CM | POA: Insufficient documentation

## 2022-03-25 DIAGNOSIS — K625 Hemorrhage of anus and rectum: Secondary | ICD-10-CM | POA: Insufficient documentation

## 2022-03-25 DIAGNOSIS — R112 Nausea with vomiting, unspecified: Secondary | ICD-10-CM | POA: Diagnosis not present

## 2022-03-25 DIAGNOSIS — R1012 Left upper quadrant pain: Secondary | ICD-10-CM | POA: Diagnosis present

## 2022-03-25 DIAGNOSIS — R197 Diarrhea, unspecified: Secondary | ICD-10-CM | POA: Insufficient documentation

## 2022-03-25 LAB — URINALYSIS, MICROSCOPIC (REFLEX): WBC, UA: NONE SEEN WBC/hpf (ref 0–5)

## 2022-03-25 LAB — LIPASE, BLOOD: Lipase: 16 U/L (ref 11–51)

## 2022-03-25 LAB — COMPREHENSIVE METABOLIC PANEL
ALT: 21 U/L (ref 0–44)
AST: 17 U/L (ref 15–41)
Albumin: 3.9 g/dL (ref 3.5–5.0)
Alkaline Phosphatase: 83 U/L (ref 38–126)
Anion gap: 7 (ref 5–15)
BUN: 10 mg/dL (ref 6–20)
CO2: 29 mmol/L (ref 22–32)
Calcium: 9.2 mg/dL (ref 8.9–10.3)
Chloride: 103 mmol/L (ref 98–111)
Creatinine, Ser: 0.79 mg/dL (ref 0.44–1.00)
GFR, Estimated: >60 mL/min (ref >60)
Glucose, Bld: 82 mg/dL (ref 70–99)
Potassium: 3.6 mmol/L (ref 3.5–5.1)
Sodium: 139 mmol/L (ref 135–145)
Total Bilirubin: 0.2 mg/dL — ABNORMAL LOW (ref 0.3–1.2)
Total Protein: 6.9 g/dL (ref 6.5–8.1)

## 2022-03-25 LAB — URINALYSIS, ROUTINE W REFLEX MICROSCOPIC
Bilirubin Urine: NEGATIVE
Glucose, UA: NEGATIVE mg/dL
Ketones, ur: NEGATIVE mg/dL
Leukocytes,Ua: NEGATIVE
Nitrite: NEGATIVE
Protein, ur: NEGATIVE mg/dL
Specific Gravity, Urine: 1.025 (ref 1.005–1.030)
pH: 5.5 (ref 5.0–8.0)

## 2022-03-25 LAB — CBC
HCT: 40.6 % (ref 36.0–46.0)
Hemoglobin: 13 g/dL (ref 12.0–15.0)
MCH: 28.3 pg (ref 26.0–34.0)
MCHC: 32 g/dL (ref 30.0–36.0)
MCV: 88.5 fL (ref 80.0–100.0)
Platelets: 419 10*3/uL — ABNORMAL HIGH (ref 150–400)
RBC: 4.59 MIL/uL (ref 3.87–5.11)
RDW: 14 % (ref 11.5–15.5)
WBC: 10.9 10*3/uL — ABNORMAL HIGH (ref 4.0–10.5)
nRBC: 0 % (ref 0.0–0.2)

## 2022-03-25 LAB — OCCULT BLOOD X 1 CARD TO LAB, STOOL: Fecal Occult Bld: POSITIVE — AB

## 2022-03-25 MED ORDER — FENTANYL CITRATE PF 50 MCG/ML IJ SOSY
50.0000 ug | PREFILLED_SYRINGE | Freq: Once | INTRAMUSCULAR | Status: AC
  Administered 2022-03-25: 50 ug via INTRAVENOUS
  Filled 2022-03-25: qty 1

## 2022-03-25 MED ORDER — SODIUM CHLORIDE 0.9 % IV BOLUS
1000.0000 mL | Freq: Once | INTRAVENOUS | Status: AC
  Administered 2022-03-25: 1000 mL via INTRAVENOUS

## 2022-03-25 MED ORDER — ONDANSETRON HCL 4 MG PO TABS: 4.0000 mg | ORAL_TABLET | Freq: Three times a day (TID) | ORAL | 0 refills | Status: AC | PRN

## 2022-03-25 NOTE — ED Triage Notes (Signed)
Reports started having blood stools since Tuesday.  Called PCP and was told to come to ER. Reports pain bright red blood with clots.  Reports since 1am has gone 4 times.  Complaisn of abd pain described as cramping.

## 2022-03-25 NOTE — ED Provider Notes (Signed)
[facility] EMERGENCY DEPARTMENT Provider Note   CSN: [id number] Arrival date & time: 03/25/22  1202     History  Chief Complaint  Patient presents with   Blood In Stools    Ianna Salmela is a 46 y.o. female.  The history is provided by the patient and medical records. No language interpreter was used.  Abdominal Pain Pain location:  LUQ Pain quality: aching and cramping   Pain radiates to:  Does not radiate Pain severity:  Moderate Onset quality:  Gradual Duration:  3 days Timing:  Constant Progression:  Waxing and waning Chronicity:  New Context: not trauma   Relieved by:  Nothing Worsened by:  Nothing Ineffective treatments:  None tried Associated symptoms: diarrhea, fatigue, hematochezia, nausea and vomiting   Associated symptoms: no chest pain, no chills, no constipation, no cough, no dysuria, no fever, no hematemesis, no hematuria, no shortness of breath, no vaginal bleeding and no vaginal discharge        Home Medications Prior to Admission medications   Medication Sig Start Date End Date Taking? Authorizing Provider  buPROPion (WELLBUTRIN XL) 300 MG 24 hr tablet Take 1 tablet by mouth daily. 04/26/21   [provider]  dicyclomine (BENTYL) 20 MG tablet Take 1 tablet (20 mg total) by mouth 2 (two) times daily. 05/06/21   Deatra Canter, Amjad, PA-C  HUMIRA PEN 40 MG/0.4ML PNKT SMARTSIG:40 Milligram(s) SUB-Q Once a Week 06/25/21   [provider]  naproxen (NAPROSYN) 500 MG tablet Take 1 tablet (500 mg total) by mouth 2 (two) times daily. 03/02/22   Truddie Hidden, MD  ondansetron (ZOFRAN-ODT) 4 MG disintegrating tablet Take 1 tablet (4 mg total) by mouth every 8 (eight) hours as needed for nausea or vomiting. 05/06/21   Evlyn Courier, PA-C  sertraline (ZOLOFT) 100 MG tablet Take 100 mg by mouth daily.    [provider]  tizanidine (ZANAFLEX) 2 MG capsule Take 2 mg by mouth 3 (three) times daily.    [provider]  traMADol  (ULTRAM) 50 MG tablet Take 50 mg by mouth every 6 (six) hours as needed for moderate pain.    [provider]      Allergies    Nystatin, Oxycodone, Shellfish allergy, Codeine, Ivp dye [iodinated contrast media], Percocet [oxycodone-acetaminophen], Penicillin g, Sulfamethoxazole-trimethoprim, Sumatriptan, and Topiramate    Review of Systems   Review of Systems  Constitutional:  Positive for fatigue. Negative for chills, diaphoresis and fever.  HENT:  Negative for congestion.   Respiratory:  Negative for cough, chest tightness, shortness of breath and wheezing.   Cardiovascular:  Negative for chest pain.  Gastrointestinal:  Positive for abdominal pain, blood in stool, diarrhea, hematochezia, nausea and vomiting. Negative for abdominal distention, constipation, hematemesis and rectal pain.  Genitourinary:  Negative for dysuria, flank pain, hematuria, vaginal bleeding and vaginal discharge.  Musculoskeletal:  Negative for back pain, neck pain and neck stiffness.  Skin:  Negative for rash and wound.  Neurological:  Negative for weakness, light-headedness, numbness and headaches.  Psychiatric/Behavioral:  Negative for agitation and confusion.   All other systems reviewed and are negative.   Physical Exam Updated Vital Signs BP (!) 144/55   Pulse 88   Temp 98.2 F (36.8 C) (Oral)   Resp 18   Ht 5' (1.524 m)   Wt 102.5 kg   LMP 02/16/2015   SpO2 99%   BMI 44.14 kg/m  Physical Exam Vitals and nursing note reviewed.  Constitutional:  General: She is not in acute distress.    Appearance: She is well-developed. She is not ill-appearing, toxic-appearing or diaphoretic.  HENT:     Head: Normocephalic and atraumatic.     Nose: No congestion or rhinorrhea.     Mouth/Throat:     Mouth: Mucous membranes are dry.     Pharynx: No oropharyngeal exudate or posterior oropharyngeal erythema.  Eyes:     Extraocular Movements: Extraocular movements intact.     Conjunctiva/sclera:  Conjunctivae normal.     Pupils: Pupils are equal, round, and reactive to light.  Cardiovascular:     Rate and Rhythm: Normal rate and regular rhythm.     Heart sounds: No murmur heard. Pulmonary:     Effort: Pulmonary effort is normal. No respiratory distress.     Breath sounds: Normal breath sounds. No wheezing, rhonchi or rales.  Chest:     Chest wall: No tenderness.  Abdominal:     General: Abdomen is flat.     Palpations: Abdomen is soft.     Tenderness: There is abdominal tenderness. There is no right CVA tenderness, left CVA tenderness, guarding or rebound.  Musculoskeletal:        General: No swelling or tenderness.     Cervical back: Neck supple. No tenderness.     Right lower leg: No edema.     Left lower leg: No edema.  Skin:    General: Skin is warm and dry.     Capillary Refill: Capillary refill takes less than 2 seconds.     Findings: No erythema or rash.  Neurological:     General: No focal deficit present.     Mental Status: She is alert.  Psychiatric:        Mood and Affect: Mood normal.     ED Results / Procedures / Treatments   Labs (all labs ordered are listed, but only abnormal results are displayed) Labs Reviewed  COMPREHENSIVE METABOLIC PANEL - Abnormal; Notable for the following components:      Result Value   Total Bilirubin 0.2 (*)    All other components within normal limits  CBC - Abnormal; Notable for the following components:   WBC 10.9 (*)    Platelets 419 (*)    All other components within normal limits  URINALYSIS, ROUTINE W REFLEX MICROSCOPIC - Abnormal; Notable for the following components:   Hgb urine dipstick MODERATE (*)    All other components within normal limits  OCCULT BLOOD X 1 CARD TO LAB, STOOL - Abnormal; Notable for the following components:   Fecal Occult Bld POSITIVE (*)    All other components within normal limits  URINALYSIS, MICROSCOPIC (REFLEX) - Abnormal; Notable for the following components:   Bacteria, UA FEW  (*)    All other components within normal limits  LIPASE, BLOOD  POC OCCULT BLOOD, ED    EKG None  Radiology CT ABDOMEN PELVIS WO CONTRAST  Result Date: 03/25/2022 CLINICAL DATA:  Left upper quadrant abdominal pain and rectal bleeding. EXAM: CT ABDOMEN AND PELVIS WITHOUT CONTRAST TECHNIQUE: Multidetector CT imaging of the abdomen and pelvis was performed following the standard protocol without IV contrast. RADIATION DOSE REDUCTION: This exam was performed according to the departmental dose-optimization program which includes automated exposure control, adjustment of the mA and/or kV according to patient size and/or use of iterative reconstruction technique. COMPARISON:  06/26/2021 FINDINGS: Lower chest: Unremarkable. Hepatobiliary: No suspicious focal abnormality in the liver on this study without intravenous contrast. Gallbladder surgically absent.  No intrahepatic or extrahepatic biliary dilation. Pancreas: No focal mass lesion. No dilatation of the main duct. No intraparenchymal cyst. No peripancreatic edema. Spleen: No splenomegaly. No focal mass lesion. Adrenals/Urinary Tract: No adrenal nodule or mass. Kidneys unremarkable. No evidence for hydroureter. The urinary bladder appears normal for the degree of distention. Stomach/Bowel: Stomach is unremarkable. No gastric wall thickening. No evidence of outlet obstruction. Duodenum is normally positioned as is the ligament of Treitz. No small bowel wall thickening. No small bowel dilatation. The terminal ileum is normal. The appendix is normal. No gross colonic mass. No colonic wall thickening. No appreciable diverticular disease in the left colon. No evidence for diverticulitis. Vascular/Lymphatic: No abdominal aortic aneurysm. There is no gastrohepatic or hepatoduodenal ligament lymphadenopathy. No retroperitoneal or mesenteric lymphadenopathy. No pelvic sidewall lymphadenopathy. Reproductive: Uterus surgically absent.  There is no adnexal mass. Other:  No intraperitoneal free fluid. Musculoskeletal: No worrisome lytic or sclerotic osseous abnormality. 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 IMPRESSION: No acute findings in the abdomen or pelvis. Specifically, no findings to explain the patient's history of left upper quadrant pain and rectal bleeding. No CT findings of diverticulitis. Electronically Signed   By: Misty Stanley M.D.   On: 03/25/2022 16:18    Procedures Procedures    Medications Ordered in ED Medications  fentaNYL (SUBLIMAZE) injection 50 mcg (50 mcg Intravenous Given 03/25/22 1548)  sodium chloride 0.9 % bolus 1,000 mL (0 mLs Intravenous Stopped 03/25/22 1708)  fentaNYL (SUBLIMAZE) injection 50 mcg (50 mcg Intravenous Given 03/25/22 1718)    ED Course/ Medical Decision Making/ A&P                           Medical Decision Making Amount and/or Complexity of Data Reviewed Labs: ordered. Radiology: ordered.  Risk Prescription drug management.    Quinetta Shilling is a 46 y.o. female with a past medical history significant for previous hysterectomy, restless leg syndrome, and previous vertigo who presents with 3 days of nausea, vomiting, abdominal pain, and diarrhea with bloody stools.  Patient reports that her symptoms been going on for the last few days and she is never had this before.  She reports it started with nausea and vomiting that was nonbloody and nonbilious and then progressed to diarrhea with bloody stools.  Now she is just having blood clots coming out rectally she reports.  She reports having 8 out of 10 abdominal pain primary on the left abdomen and left upper quadrant.  Denies history of trauma.  Denies history of pancreatitis or diverticulitis.  Has had recent shingles but denies any rash on her left torso.  Denies any urinary changes or vaginal symptoms.  Denies any fevers but does report chills.  Denies any chest pain, shortness of breath or palpitations.  Denies  cough or congestion or URI symptoms.  Denies sick contacts.  Does not specifically report any suspicious foods.  On exam, left upper abdomen was tender to palpation but there is no evidence of shingles.  No rash.  Rest of abdomen nontender and back and flanks nontender as well.  Patient otherwise resting comfortably.  Some mild dry mucous membranes on exam.  Clinically I do feel we need to rule out acute diverticulitis given the left-sided abdominal pain with rectal bleeding, nausea, vomiting, and some fatigue.  Due to the abdominal pain we will get a urinalysis as well.  Patient was given some pain medicine although we are limited  with what she could receive due to allergies.  After pain medicine and fluids patient was feeling much better.  Patient's work-up returned overall reassuring.  No evidence of UTI.  Patient was not anemic and her CT scan did not show acute diverticulitis.  Clinically I suspect a viral gastroenteritis causing some rectal bleeding during the many episodes of diarrhea.  She did have an external hemorrhoid on rectal exam with a chaperone and her fecal occult was positive confirming the bleeding however it was not thrombosed and there was no rectal pain.  Unclear if she has some mild internal hemorrhoid that cannot be palpated on exam however we do recommend having her follow-up with outpatient GI and she agrees.    Patient agrees with discharge with prescription for nausea medicine and outpatient follow-up.  She understood return precautions and follow-up instructions and was discharged in good condition with reassuring vital signs.         Final Clinical Impression(s) / ED Diagnoses Final diagnoses:  Nausea vomiting and diarrhea  Rectal bleeding  Left upper quadrant abdominal pain    Rx / DC Orders ED Discharge Orders          Ordered    ondansetron (ZOFRAN) 4 MG tablet  Every 8 hours PRN        03/25/22 1939            Clinical Impression: 1. Nausea  vomiting and diarrhea   2. Rectal bleeding   3. Left upper quadrant abdominal pain     Disposition: Discharge  Condition: Good  I have discussed the results, Dx and Tx plan with the pt(& family if present). He/she/they expressed understanding and agree(s) with the plan. Discharge instructions discussed at great length. Strict return precautions discussed and pt &/or family have verbalized understanding of the instructions. No further questions at time of discharge.    New Prescriptions   ONDANSETRON (ZOFRAN) 4 MG TABLET    Take 1 tablet (4 mg total) by mouth every 8 (eight) hours as needed for nausea or vomiting.    Follow Up: Berkley Harvey, NP [place] [address] [phone]     Gastroenterology, [place] [address] [phone]     [facility] [place] [id number] [phone]    [facility] [place] [facility] [address] [id number] [facility] [place] [phone]        Kareena Arrambide, Gwenyth Allegra, MD 03/25/22 1944

## 2022-03-25 NOTE — Discharge Instructions (Signed)
Your history, exam, evaluation today are consistent with a likely viral gastroenteritis causing the nausea, vomiting, diarrhea, and some bloody stools.  Your CT scan did not show evidence of colitis, diverticulitis, or other acute abnormality.  Your hemoglobin was normal and your other labs were overall reassuring as we discussed.  I suspect that the diarrhea may have caused some of the bleeding and although you had a small external hemorrhoid, it did not appear thrombosed or exquisitely tender.  We do feel you are safe for discharge home but do recommend having you follow-up with a GI doctor and PCP.  Please rest and stay hydrated.  If any symptoms change or worsen acutely, please return to the nearest emergency department.

## 2022-04-06 IMAGING — CT CT ANGIO CHEST
2 of 6 series · 17 of 36 positions shown · IV contrast (agent unspecified)
Comparison: 06/26/2021, 05/14/2013, 05/06/2021

CLINICAL DATA: Abdominal pain, positive D-dimer, shortness of
breath, mid chest pain



[Series 5: pe thins · axial · 0.89mm/px · z∈[+1191,+1431]mm · 16 of 271 slices shown]
[im 16/271  lung]
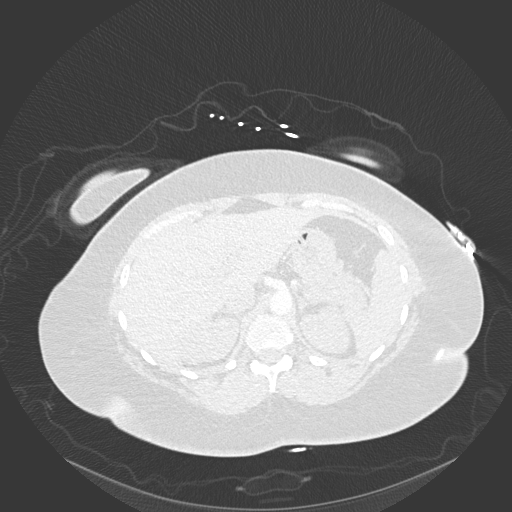
[im 31/271  mediastinal]
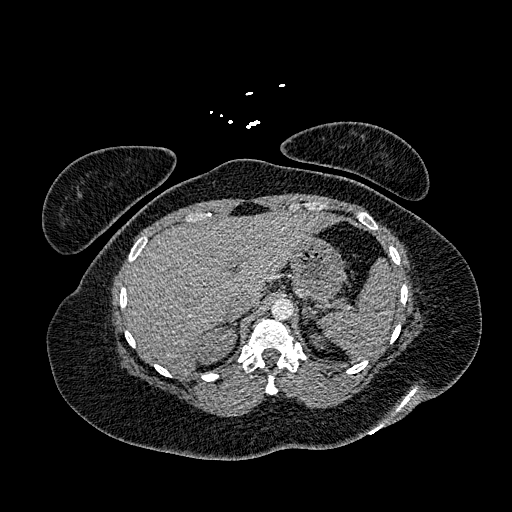
[im 46/271  lung]
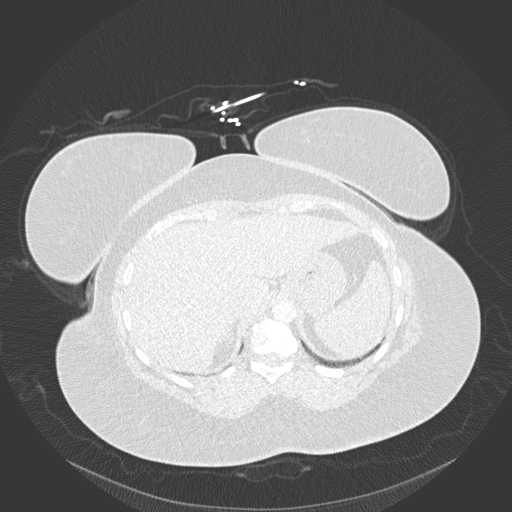
[im 61/271  mediastinal]
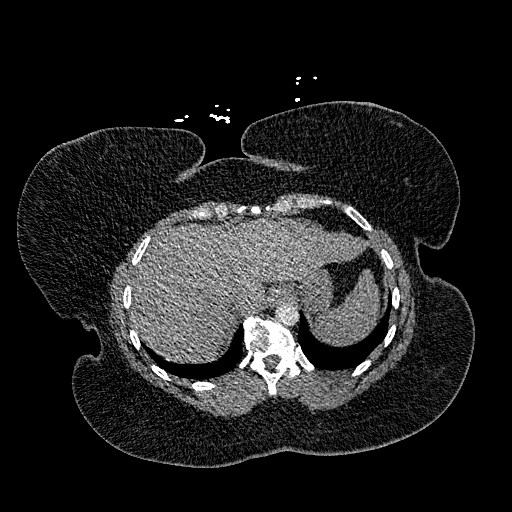
[im 76/271  lung]
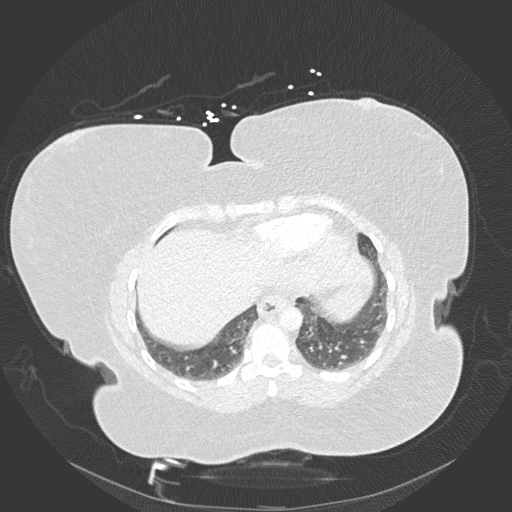
[im 91/271  mediastinal]
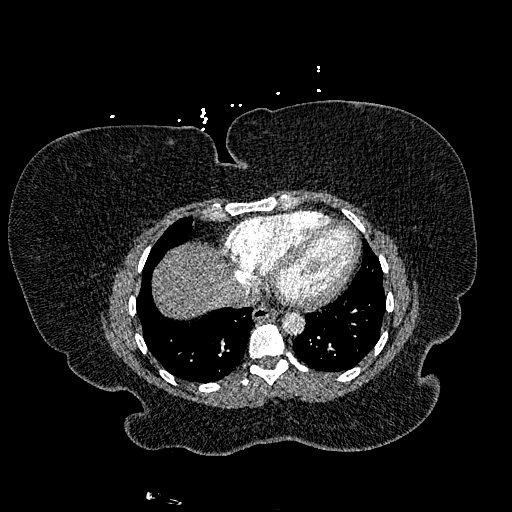
[im 106/271  lung]
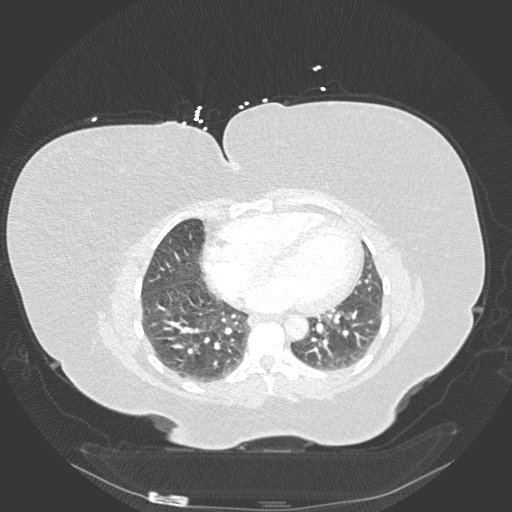
[im 121/271  mediastinal]
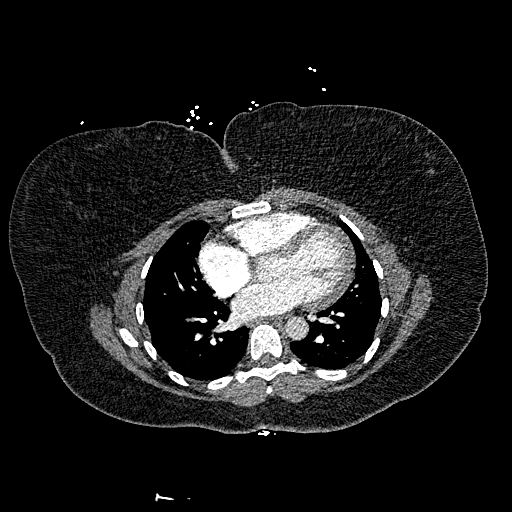
[im 151/271  lung]
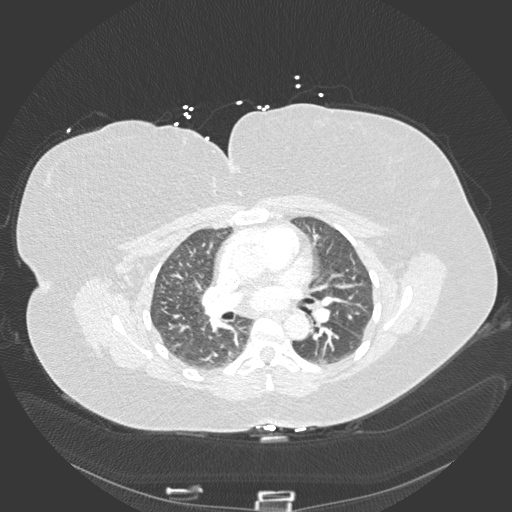
[im 166/271  mediastinal]
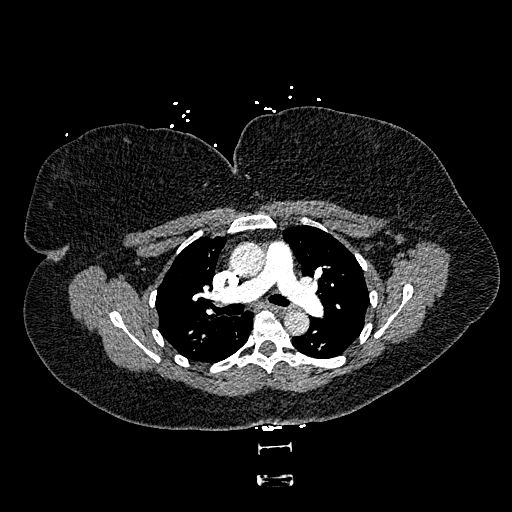
[im 181/271  lung]
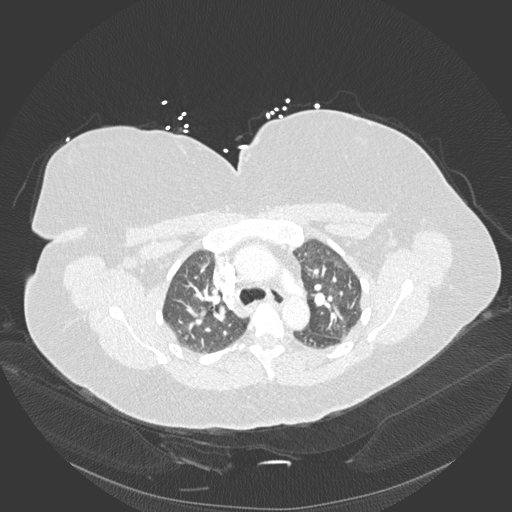
[im 196/271  mediastinal]
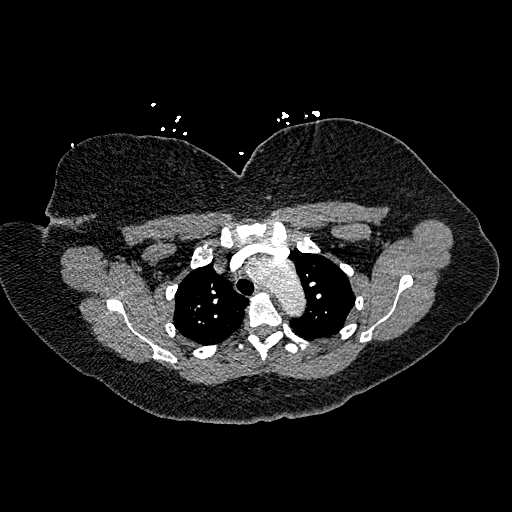
[im 211/271  lung]
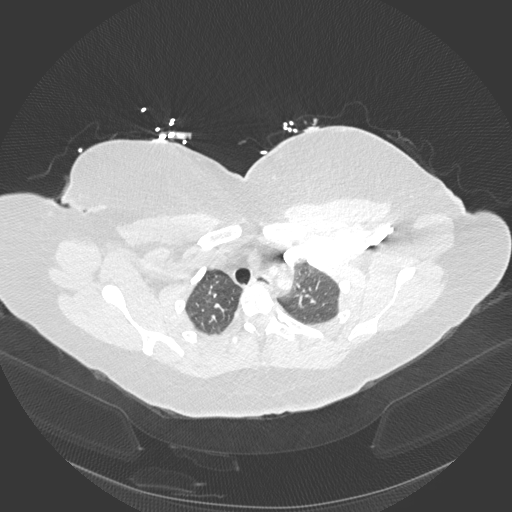
[im 226/271  mediastinal]
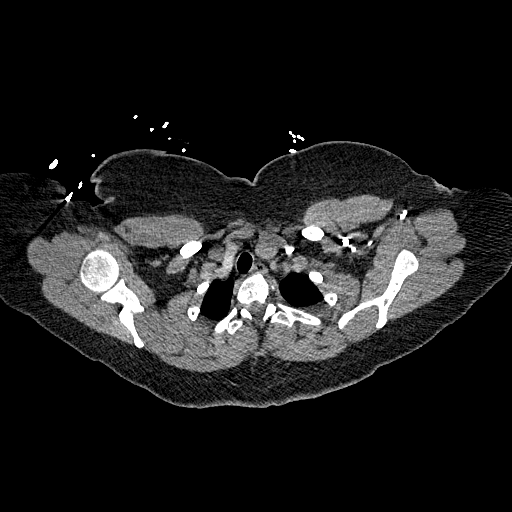
[im 241/271  lung]
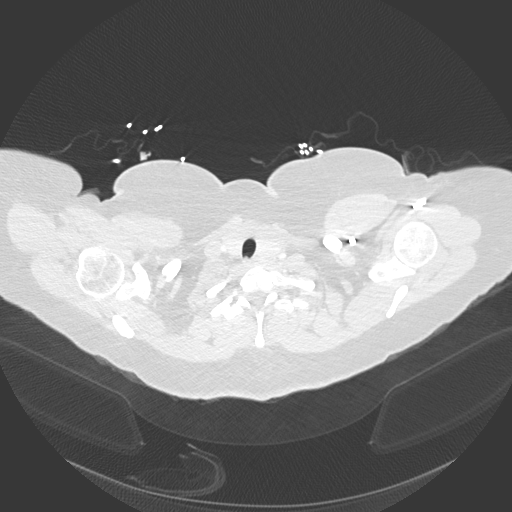
[im 256/271  mediastinal]
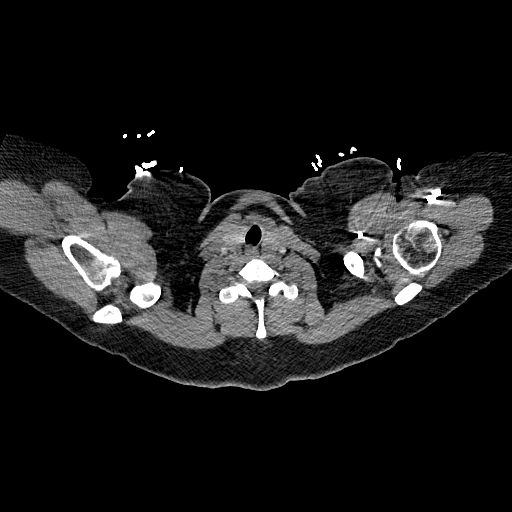

[Series 6: pe 2mm cor · coronal · 0.53mm/px · 1 of 151 slices shown]
[im 76/151  mediastinal]
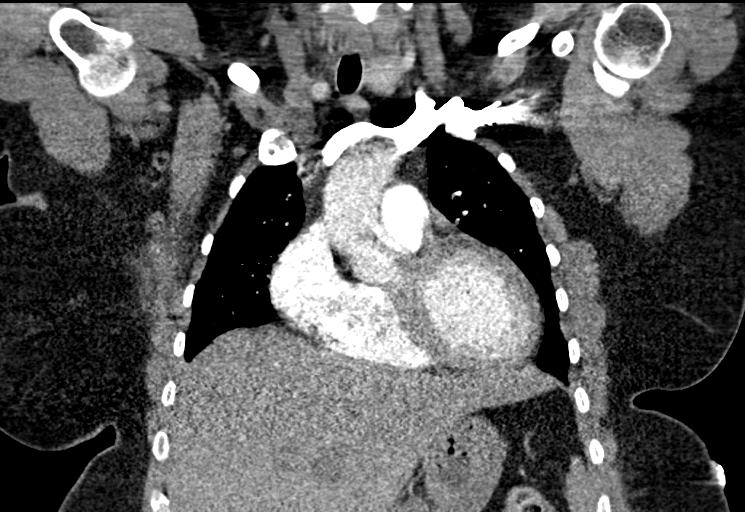

[17 of 36 positions shown; findings below may reference images not displayed]

RADIATION DOSE REDUCTION: This exam was performed according to the
departmental dose-optimization program which includes automated
exposure control, adjustment of the mA and/or kV according to
patient size and/or use of iterative reconstruction technique.

CONTRAST:  100mL OMNIPAQUE IOHEXOL 350 MG/ML SOLN
FINDINGS: CTA CHEST FINDINGS

Cardiovascular: This is a technically adequate evaluation of the
pulmonary vasculature. No filling defects or pulmonary emboli.

Mild cardiomegaly without pericardial effusion. No evidence of
thoracic aortic aneurysm or dissection.

Mediastinum/Nodes: No enlarged mediastinal, hilar, or axillary lymph
nodes. Thyroid gland, trachea, and esophagus demonstrate no
significant findings.

Lungs/Pleura: No acute airspace disease, effusion, or pneumothorax.
Central airways are patent.

Musculoskeletal: No acute or destructive bony lesions. Chronic T11
compression deformity. Reconstructed images demonstrate no
additional findings.

Review of the MIP images confirms the above findings.

CT ABDOMEN and PELVIS FINDINGS

Hepatobiliary: No focal liver abnormality is seen. Status post
cholecystectomy. No biliary dilatation.

Pancreas: Unremarkable. No pancreatic ductal dilatation or
surrounding inflammatory changes.

Spleen: Normal in size without focal abnormality.

Adrenals/Urinary Tract: Kidneys enhance normally and symmetrically.
No urinary tract calculi or obstructive uropathy. The adrenals are
unremarkable. Bladder is decompressed, limiting its evaluation.

Stomach/Bowel: No bowel obstruction or ileus. No bowel wall
thickening or inflammatory change. Normal appendix right lower
quadrant.

Vascular/Lymphatic: No significant vascular findings are present. No
enlarged abdominal or pelvic lymph nodes.

Reproductive: Status post hysterectomy. No adnexal masses.

Other: No free fluid or free gas.  No abdominal wall hernia.

Musculoskeletal: No acute or destructive bony lesions. Reconstructed
images demonstrate no additional findings.

Review of the MIP images confirms the above findings.
IMPRESSION: 1. No evidence of pulmonary embolus.
2. No acute intrathoracic, intra-abdominal, or intrapelvic process.

## 2023-01-24 ENCOUNTER — Encounter (HOSPITAL_BASED_OUTPATIENT_CLINIC_OR_DEPARTMENT_OTHER): Payer: Self-pay

## 2023-01-24 ENCOUNTER — Other Ambulatory Visit: Payer: Self-pay

## 2023-01-24 ENCOUNTER — Emergency Department (HOSPITAL_BASED_OUTPATIENT_CLINIC_OR_DEPARTMENT_OTHER): Payer: 59

## 2023-01-24 ENCOUNTER — Emergency Department (HOSPITAL_BASED_OUTPATIENT_CLINIC_OR_DEPARTMENT_OTHER)
Admission: EM | Admit: 2023-01-24 | Discharge: 2023-01-24 | Disposition: A | Discharge status: Home / Self Care | Payer: 59 | Attending: Emergency Medicine | Admitting: Emergency Medicine

## 2023-01-24 DIAGNOSIS — R1012 Left upper quadrant pain: Secondary | ICD-10-CM | POA: Insufficient documentation

## 2023-01-24 DIAGNOSIS — R112 Nausea with vomiting, unspecified: Secondary | ICD-10-CM | POA: Diagnosis not present

## 2023-01-24 DIAGNOSIS — R1013 Epigastric pain: Secondary | ICD-10-CM | POA: Diagnosis not present

## 2023-01-24 DIAGNOSIS — E876 Hypokalemia: Secondary | ICD-10-CM | POA: Insufficient documentation

## 2023-01-24 DIAGNOSIS — L732 Hidradenitis suppurativa: Secondary | ICD-10-CM | POA: Insufficient documentation

## 2023-01-24 DIAGNOSIS — R630 Anorexia: Secondary | ICD-10-CM | POA: Diagnosis not present

## 2023-01-24 DIAGNOSIS — R1010 Upper abdominal pain, unspecified: Secondary | ICD-10-CM | POA: Diagnosis present

## 2023-01-24 DIAGNOSIS — R110 Nausea: Secondary | ICD-10-CM

## 2023-01-24 HISTORY — DX: Hidradenitis suppurativa: L73.2

## 2023-01-24 HISTORY — DX: Irritable bowel syndrome, unspecified: K58.9

## 2023-01-24 LAB — COMPREHENSIVE METABOLIC PANEL
ALT: 16 U/L (ref 0–44)
AST: 17 U/L (ref 15–41)
Albumin: 3.6 g/dL (ref 3.5–5.0)
Alkaline Phosphatase: 77 U/L (ref 38–126)
Anion gap: 9 (ref 5–15)
BUN: 11 mg/dL (ref 6–20)
CO2: 21 mmol/L — ABNORMAL LOW (ref 22–32)
Calcium: 8.7 mg/dL — ABNORMAL LOW (ref 8.9–10.3)
Chloride: 107 mmol/L (ref 98–111)
Creatinine, Ser: 0.79 mg/dL (ref 0.44–1.00)
GFR, Estimated: >60 mL/min (ref >60)
Glucose, Bld: 89 mg/dL (ref 70–99)
Potassium: 3.1 mmol/L — ABNORMAL LOW (ref 3.5–5.1)
Sodium: 137 mmol/L (ref 135–145)
Total Bilirubin: 0.4 mg/dL (ref 0.3–1.2)
Total Protein: 8 g/dL (ref 6.5–8.1)

## 2023-01-24 LAB — CBC WITH DIFFERENTIAL/PLATELET
Abs Immature Granulocytes: 0.02 10*3/uL (ref 0.00–0.07)
Basophils Absolute: 0 10*3/uL (ref 0.0–0.1)
Basophils Relative: 0 %
Eosinophils Absolute: 0.1 10*3/uL (ref 0.0–0.5)
Eosinophils Relative: 1 %
HCT: 40.1 % (ref 36.0–46.0)
Hemoglobin: 12.7 g/dL (ref 12.0–15.0)
Immature Granulocytes: 0 %
Lymphocytes Relative: 13 %
Lymphs Abs: 1.2 10*3/uL (ref 0.7–4.0)
MCH: 26.4 pg (ref 26.0–34.0)
MCHC: 31.7 g/dL (ref 30.0–36.0)
MCV: 83.4 fL (ref 80.0–100.0)
Monocytes Absolute: 0.6 10*3/uL (ref 0.1–1.0)
Monocytes Relative: 6 %
Neutro Abs: 7.5 10*3/uL (ref 1.7–7.7)
Neutrophils Relative %: 80 %
Platelets: 429 10*3/uL — ABNORMAL HIGH (ref 150–400)
RBC: 4.81 MIL/uL (ref 3.87–5.11)
RDW: 14.9 % (ref 11.5–15.5)
WBC: 9.4 10*3/uL (ref 4.0–10.5)
nRBC: 0 % (ref 0.0–0.2)

## 2023-01-24 LAB — URINALYSIS, ROUTINE W REFLEX MICROSCOPIC
Bilirubin Urine: NEGATIVE
Glucose, UA: NEGATIVE mg/dL
Ketones, ur: NEGATIVE mg/dL
Leukocytes,Ua: NEGATIVE
Nitrite: NEGATIVE
Protein, ur: NEGATIVE mg/dL
Specific Gravity, Urine: 1.02 (ref 1.005–1.030)
pH: 8 (ref 5.0–8.0)

## 2023-01-24 LAB — URINALYSIS, MICROSCOPIC (REFLEX)

## 2023-01-24 LAB — LIPASE, BLOOD: Lipase: 21 U/L (ref 11–51)

## 2023-01-24 MED ORDER — POTASSIUM CHLORIDE CRYS ER 20 MEQ PO TBCR: 20.0000 meq | EXTENDED_RELEASE_TABLET | Freq: Two times a day (BID) | ORAL | 0 refills | Status: AC

## 2023-01-24 MED ORDER — ALUM & MAG HYDROXIDE-SIMETH 200-200-20 MG/5ML PO SUSP
30.0000 mL | Freq: Once | ORAL | Status: AC
  Administered 2023-01-24: 30 mL via ORAL
  Filled 2023-01-24: qty 30

## 2023-01-24 MED ORDER — ONDANSETRON HCL 4 MG/2ML IJ SOLN
4.0000 mg | Freq: Once | INTRAMUSCULAR | Status: AC
  Administered 2023-01-24: 4 mg via INTRAVENOUS
  Filled 2023-01-24: qty 2

## 2023-01-24 MED ORDER — IBUPROFEN 800 MG PO TABS
800.0000 mg | ORAL_TABLET | Freq: Once | ORAL | Status: AC
  Administered 2023-01-24: 800 mg via ORAL
  Filled 2023-01-24: qty 1

## 2023-01-24 MED ORDER — DOXYCYCLINE HYCLATE 100 MG PO CAPS: 100.0000 mg | ORAL_CAPSULE | Freq: Two times a day (BID) | ORAL | 0 refills | Status: DC

## 2023-01-24 MED ORDER — SUCRALFATE 1 G PO TABS: 1.0000 g | ORAL_TABLET | Freq: Three times a day (TID) | ORAL | 0 refills | Status: AC

## 2023-01-24 MED ORDER — SODIUM CHLORIDE 0.9 % IV BOLUS
1000.0000 mL | Freq: Once | INTRAVENOUS | Status: AC
  Administered 2023-01-24: 1000 mL via INTRAVENOUS

## 2023-01-24 MED ORDER — DICYCLOMINE HCL 10 MG PO CAPS
10.0000 mg | ORAL_CAPSULE | Freq: Once | ORAL | Status: AC
  Administered 2023-01-24: 10 mg via ORAL
  Filled 2023-01-24: qty 1

## 2023-01-24 NOTE — ED Notes (Signed)
Reviewed discharge instructions and follow up  Questions answered regarding medications

## 2023-01-24 NOTE — ED Provider Notes (Signed)
[place] [facility] AT [facility] Provider Note   CSN: [id number] Arrival date & time: 01/24/23  4098     History  Chief Complaint  Patient presents with   Abdominal Pain    Jillian Baker is a 47 y.o. female with past medical history significant for IBS, hidradenitis suppurativa, nicotine dependence (vape use) presents to the ED complaining of upper abdominal pain for the past 8 days.  Patient states she also feels nauseated with decreased appetite, but has not had any vomiting.  Patient reports doing shots of Bicardi for 3 weeks due to increased stress in her life related to being separated from her spouse.  She states she has not consumed any alcohol in the past week.  She has been taking ibuprofen for her pain.  She states her HS is also flared up due to her not having her Humira in 2 months, but is in the process of getting this resolved.  Patient takes Linzess for IBS.  She has not had a bowel movement in a week.  Denies fever, dysuria, flank pain.         Home Medications Prior to Admission medications   Medication Sig Start Date End Date Taking? Authorizing Provider  potassium chloride SA (KLOR-CON M) 20 MEQ tablet Take 1 tablet (20 mEq total) by mouth 2 (two) times daily for 10 days. 01/24/23 02/03/23 Yes Iantha Titsworth R, PA-C  buPROPion (WELLBUTRIN XL) 300 MG 24 hr tablet Take 1 tablet by mouth daily. 04/26/21   [provider]  dicyclomine (BENTYL) 20 MG tablet Take 1 tablet (20 mg total) by mouth 2 (two) times daily. 05/06/21   Marita Kansas, PA-C  doxycycline (VIBRAMYCIN) 100 MG capsule Take 1 capsule (100 mg total) by mouth 2 (two) times daily. 01/24/23  Yes Roe Wilner R, PA-C  HUMIRA PEN 40 MG/0.4ML PNKT SMARTSIG:40 Milligram(s) SUB-Q Once a Week 06/25/21   [provider]  naproxen (NAPROSYN) 500 MG tablet Take 1 tablet (500 mg total) by mouth 2 (two) times daily. 03/02/22   Pollyann Savoy, MD  ondansetron (ZOFRAN) 4 MG tablet  Take 1 tablet (4 mg total) by mouth every 8 (eight) hours as needed for nausea or vomiting. 03/25/22   Tegeler, Canary Brim, MD  ondansetron (ZOFRAN-ODT) 4 MG disintegrating tablet Take 1 tablet (4 mg total) by mouth every 8 (eight) hours as needed for nausea or vomiting. 05/06/21   Marita Kansas, PA-C  sertraline (ZOLOFT) 100 MG tablet Take 100 mg by mouth daily.    [provider]  sucralfate (CARAFATE) 1 g tablet Take 1 tablet (1 g total) by mouth 4 (four) times daily -  with meals and at bedtime. 01/24/23 02/23/23 Yes Shereda Graw R, PA-C  tizanidine (ZANAFLEX) 2 MG capsule Take 2 mg by mouth 3 (three) times daily.    [provider]  traMADol (ULTRAM) 50 MG tablet Take 50 mg by mouth every 6 (six) hours as needed for moderate pain.    [provider]      Allergies    Nystatin, Oxycodone, Shellfish allergy, Codeine, Ivp dye [iodinated contrast media], Percocet [oxycodone-acetaminophen], Penicillin g, Sulfamethoxazole-trimethoprim, Sumatriptan, and Topiramate    Review of Systems   Review of Systems  Constitutional:  Positive for appetite change (decreased). Negative for fever.  Gastrointestinal:  Positive for abdominal pain, constipation and nausea. Negative for diarrhea and vomiting.  Genitourinary:  Negative for dysuria and flank pain.  Skin:        HS flare  in left axilla    Physical Exam Updated Vital Signs BP 114/71   Pulse 90   Temp 98.4 F (36.9 C) (Oral)   Resp (!) 22   Wt 99.8 kg   LMP 02/16/2015   SpO2 100%   BMI 42.97 kg/m  Physical Exam Vitals and nursing note reviewed.  Constitutional:      General: She is not in acute distress.    Appearance: Normal appearance. She is not ill-appearing or diaphoretic.  HENT:     Mouth/Throat:     Mouth: Mucous membranes are dry.  Cardiovascular:     Rate and Rhythm: Normal rate and regular rhythm.  Pulmonary:     Effort: Pulmonary effort is normal.  Abdominal:     General: Abdomen is flat. Bowel  sounds are normal. There is no distension.     Palpations: Abdomen is soft.     Tenderness: There is abdominal tenderness in the epigastric area and left upper quadrant. There is no guarding or rebound.     Hernia: No hernia is present.  Skin:    General: Skin is warm and dry.     Capillary Refill: Capillary refill takes less than 2 seconds.  Neurological:     Mental Status: She is alert. Mental status is at baseline.  Psychiatric:        Mood and Affect: Mood is anxious. Affect is tearful.        Behavior: Behavior normal.     Comments: Patient is anxious and tearful when talking about current life events and stressors.      ED Results / Procedures / Treatments   Labs (all labs ordered are listed, but only abnormal results are displayed) Labs Reviewed  COMPREHENSIVE METABOLIC PANEL - Abnormal; Notable for the following components:      Result Value   Potassium 3.1 (*)    CO2 21 (*)    Calcium 8.7 (*)    All other components within normal limits  URINALYSIS, ROUTINE W REFLEX MICROSCOPIC - Abnormal; Notable for the following components:   APPearance HAZY (*)    Hgb urine dipstick TRACE (*)    All other components within normal limits  CBC WITH DIFFERENTIAL/PLATELET - Abnormal; Notable for the following components:   Platelets 429 (*)    All other components within normal limits  URINALYSIS, MICROSCOPIC (REFLEX) - Abnormal; Notable for the following components:   Bacteria, UA RARE (*)    All other components within normal limits  LIPASE, BLOOD    EKG None  Radiology CT ABDOMEN PELVIS WO CONTRAST  Result Date: 01/24/2023 CLINICAL DATA:  Abdominal pain, acute, nonlocalized. Left upper quadrant and epigastric pain for 8 days with dry heaves and constipation. EXAM: CT ABDOMEN AND PELVIS WITHOUT CONTRAST TECHNIQUE: Multidetector CT imaging of the abdomen and pelvis was performed following the standard protocol without IV contrast. RADIATION DOSE REDUCTION: This exam was  performed according to the departmental dose-optimization program which includes automated exposure control, adjustment of the mA and/or kV according to patient size and/or use of iterative reconstruction technique. COMPARISON:  Abdominopelvic CT 03/25/2022 FINDINGS: Lower chest: Clear lung bases. No significant pleural or pericardial effusion. Hepatobiliary: The liver appears stable without focal abnormality on noncontrast imaging. No evidence of biliary dilatation status post cholecystectomy. Pancreas: Unremarkable. No pancreatic ductal dilatation or surrounding inflammatory changes. Spleen: Normal in size without focal abnormality. Adrenals/Urinary Tract: Both adrenal glands appear normal. No evidence of urinary tract calculus, suspicious renal lesion or hydronephrosis. The bladder appears unremarkable  for its degree of distention. Stomach/Bowel: No enteric contrast administered. The stomach appears unremarkable for its degree of distension. No evidence of bowel wall thickening, distention or surrounding inflammatory change. The appendix appears normal. Colonic stool burden within physiologic limits. Vascular/Lymphatic: There are no enlarged abdominal or pelvic lymph nodes. Mildly prominent inguinal lymph nodes bilaterally, similar to previous study and likely reactive. No significant vascular findings on noncontrast imaging. Reproductive: Status post hysterectomy. No evidence of adnexal mass. Other: No evidence of abdominal wall mass or hernia. No ascites or pneumoperitoneum. Musculoskeletal: No acute or significant osseous findings. 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 Unless specific follow-up recommendations are mentioned in the findings or impression sections, no imaging follow-up of any mentioned incidental findings is recommended. IMPRESSION: 1. No acute findings or explanation for the patient's symptoms.  2. Stable postsurgical changes as described. Electronically Signed   By: Carey Bullocks M.D.   On: 01/24/2023 10:41    Procedures Procedures    Medications Ordered in ED Medications  sodium chloride 0.9 % bolus 1,000 mL (0 mLs Intravenous Stopped 01/24/23 1058)  ondansetron (ZOFRAN) injection 4 mg (4 mg Intravenous Given 01/24/23 0939)  ibuprofen (ADVIL) tablet 800 mg (800 mg Oral Given 01/24/23 1056)  alum & mag hydroxide-simeth (MAALOX/MYLANTA) 200-200-20 MG/5ML suspension 30 mL (30 mLs Oral Given 01/24/23 1130)  dicyclomine (BENTYL) capsule 10 mg (10 mg Oral Given 01/24/23 1137)    ED Course/ Medical Decision Making/ A&P                                 Medical Decision Making Amount and/or Complexity of Data Reviewed Labs: ordered. Radiology: ordered.  Risk OTC drugs. Prescription drug management.   This patient presents to the ED with chief complaint(s) of abdominal pain, nausea, decreased appetite.  The complaint involves an extensive differential diagnosis and also carries with it a high risk of complications and morbidity.    The differential diagnosis includes alcoholic pancreatitis, acute gastritis, constipation, bowel obstruction   The initial plan is to obtain labs, CT abd/pelvis  Initial Assessment:   Exam significant for anxious appearing patient who is not in acute distress.  She is tearful when discussing life events and current stressors.  Abdomen is soft with epigastric and left upper quadrant tenderness.  No distention, overlying skin changes, appreciable hernias.  Bowel sounds are normal.  Vital signs are stable.  Erythema and induration to the left axilla without obvious fluctuance or obvious abscess.    Independent ECG/labs interpretation:  The following labs were independently interpreted:  CBC with thrombocytosis, no anemia or leukocytosis.  UA without evidence of infection.  Metabolic panel with mild hypokalemia and hypocalcemia.  Lipase  unremarkable.  Independent visualization and interpretation of imaging: I independently visualized the following imaging with scope of interpretation limited to determining acute life threatening conditions related to emergency care: CT abdomen/pelvis, which revealed no acute findings to explain patient's symptoms.  There is mild-moderate stool burden.    Treatment and Reassessment: Patient given IV fluids and Zofran without improvement in symptoms.  Patient also given ibuprofen for discomfort.  Will give GI cocktail and reassess symptoms.    Patient tolerated GI cocktail and is requesting discharge.   Disposition:   Patient's workup does not show emergent cause for abdominal pain.  Will send doxycycline into help with hidradenitis flare, recommended dermatology follow-up  as needed.  Will also send patient home on sucralfate to help with any potential gastritis as a result of recent alcohol consumption.  Recommended patient continue to use MiraLAX for constipation.  Advised patient to follow-up with primary care provider and/or gastroenterologist.  Prescription for potassium supplement also sent in to treat mild hypokalemia.  The patient has been appropriately medically screened and/or stabilized in the ED. I have low suspicion for any other emergent medical condition which would require further screening, evaluation or treatment in the ED or require inpatient management. At time of discharge the patient is hemodynamically stable and in no acute distress. I have discussed work-up results and diagnosis with patient and answered all questions. Patient is agreeable with discharge plan. We discussed strict return precautions for returning to the emergency department and they verbalized understanding.    Social Determinants of Health:   Patient's  nicotine dependence  increases the complexity of managing their presentation         Final Clinical Impression(s) / ED Diagnoses Final diagnoses:   LUQ abdominal pain  Decreased appetite  Nausea without vomiting  Hidradenitis suppurativa of left axilla    Rx / DC Orders ED Discharge Orders          Ordered    doxycycline (VIBRAMYCIN) 100 MG capsule  2 times daily        01/24/23 1146    sucralfate (CARAFATE) 1 g tablet  3 times daily with meals & bedtime        01/24/23 1146    potassium chloride SA (KLOR-CON M) 20 MEQ tablet  2 times daily        01/24/23 1150              Lenard Simmer, PA-C 01/24/23 1151    Melene Plan, DO 01/24/23 1359

## 2023-01-24 NOTE — Discharge Instructions (Addendum)
Thank you for allowing [place] to be a part of your care today.  You were evaluated in the ED for abdominal pain and HS flare.   I have sent over prescription for doxycycline to treat your at bedtime flare.  Please follow-up with your dermatologist as needed regarding this.  Your workup today does not show an acute or emergent cause of your abdominal pain.  I recommend following up with your primary care provider and/or your gastroenterologist.  I do also recommend continuing to use MiraLAX to help with bowel movements.    I have also sent over a medication called Carafate (sucralfate) to your pharmacy to help with stomach related pains.  This is a medication we use to treat gastritis (inflammation in the stomach).   Return to the ED if you develop sudden worsening of your symptoms or if you have any new concerns.

## 2023-01-24 NOTE — ED Triage Notes (Addendum)
Pt reports upper abdomen for 8 days. Feels nauseated but can't vomit. Pt reports doing shots for 2 weeks due to stress. Then when she stopped worried alcohol inflammation in stomach. Also HS has flared up and hasn't had humira in 2 months Takes linzess for IBS

## 2023-06-09 ENCOUNTER — Other Ambulatory Visit: Payer: Self-pay

## 2023-06-09 ENCOUNTER — Emergency Department (HOSPITAL_BASED_OUTPATIENT_CLINIC_OR_DEPARTMENT_OTHER)
Admission: EM | Admit: 2023-06-09 | Discharge: 2023-06-09 | Disposition: A | Discharge status: Home / Self Care | Payer: 59 | Attending: Emergency Medicine | Admitting: Emergency Medicine

## 2023-06-09 DIAGNOSIS — L02412 Cutaneous abscess of left axilla: Secondary | ICD-10-CM | POA: Insufficient documentation

## 2023-06-09 DIAGNOSIS — L0291 Cutaneous abscess, unspecified: Secondary | ICD-10-CM

## 2023-06-09 MED ORDER — LIDOCAINE HCL (PF) 1 % IJ SOLN
5.0000 mL | Freq: Once | INTRAMUSCULAR | Status: AC
  Administered 2023-06-09: 5 mL
  Filled 2023-06-09: qty 5

## 2023-06-09 MED ORDER — DOXYCYCLINE HYCLATE 100 MG PO CAPS
100.0000 mg | ORAL_CAPSULE | Freq: Two times a day (BID) | ORAL | 0 refills | Status: AC
Start: 2023-06-09 — End: ?

## 2023-06-09 MED ORDER — LIDOCAINE-EPINEPHRINE-TETRACAINE (LET) TOPICAL GEL
3.0000 mL | Freq: Once | TOPICAL | Status: AC
  Administered 2023-06-09: 3 mL via TOPICAL
  Filled 2023-06-09: qty 3

## 2023-06-09 NOTE — Discharge Instructions (Signed)
 With your diagnosis of hidradenitis suppurativa please follow-up with your dermatologist soon as possible.  Since you have failed outpatient treatment of antibiotics we did incision and drainage here in emergency room.  However I would like for you to restart doxycycline .  Please return to emergency room if you have any new or worsening symptoms like poor wound healing or fevers chills surrounding redness.

## 2023-06-09 NOTE — ED Provider Notes (Addendum)
 [place] EMERGENCY DEPARTMENT AT [facility] Provider Note   CSN: [id number] Arrival date & time: 06/09/23  1340     History  Chief Complaint  Patient presents with   Abscess    Jillian Baker is a 48 y.o. female abscess, hidradenitis suppurativa, restless leg presenting to emergency room with left armpit abscess.  Patient reports that has been present for approximately 1-1/2 weeks.  Reports 3 days of antibiotic with no relief.  Requesting incision and drainage.  Has had history of same follows with dermatology.  Patient reports that she is on preventative medication with dermatology however missed Humira dosing 2 weeks ago she thinks this is what has provoked her abscess.  No Chest pain shortness of breath fever chills or associated symptoms.   Abscess      Home Medications Prior to Admission medications   Medication Sig Start Date End Date Taking? Authorizing Provider  buPROPion (WELLBUTRIN XL) 300 MG 24 hr tablet Take 1 tablet by mouth daily. 04/26/21   [provider]  busPIRone (BUSPAR) 5 MG tablet Take 5 mg by mouth 3 (three) times daily.    [provider]  dicyclomine  (BENTYL ) 20 MG tablet Take 1 tablet (20 mg total) by mouth 2 (two) times daily. 05/06/21   Hildegard, Amjad, PA-C  doxycycline  (VIBRAMYCIN ) 100 MG capsule Take 1 capsule (100 mg total) by mouth 2 (two) times daily. 01/24/23   Clark, Meghan R, PA-C  HUMIRA PEN 40 MG/0.4ML PNKT SMARTSIG:40 Milligram(s) SUB-Q Once a Week 06/25/21   [provider]  naproxen  (NAPROSYN ) 500 MG tablet Take 1 tablet (500 mg total) by mouth 2 (two) times daily. 03/02/22   Roselyn Carlin NOVAK, MD  ondansetron  (ZOFRAN ) 4 MG tablet Take 1 tablet (4 mg total) by mouth every 8 (eight) hours as needed for nausea or vomiting. 03/25/22   Tegeler, Lonni PARAS, MD  ondansetron  (ZOFRAN -ODT) 4 MG disintegrating tablet Take 1 tablet (4 mg total) by mouth every 8 (eight) hours as needed for nausea or vomiting. 05/06/21    Hildegard Loge, PA-C  potassium chloride  SA (KLOR-CON  M) 20 MEQ tablet Take 1 tablet (20 mEq total) by mouth 2 (two) times daily for 10 days. 01/24/23 02/03/23  Clark, Meghan R, PA-C  sertraline (ZOLOFT) 100 MG tablet Take 100 mg by mouth daily.    [provider]  sucralfate  (CARAFATE ) 1 g tablet Take 1 tablet (1 g total) by mouth 4 (four) times daily -  with meals and at bedtime. 01/24/23 02/23/23  Clark, Gerard R, PA-C  tizanidine (ZANAFLEX) 2 MG capsule Take 2 mg by mouth 3 (three) times daily.    [provider]  topiramate (TOPAMAX) 100 MG tablet Take 100 mg by mouth daily.    [provider]  traMADol  (ULTRAM ) 50 MG tablet Take 50 mg by mouth every 6 (six) hours as needed for moderate pain.    [provider]      Allergies    Nystatin, Oxycodone, Shellfish allergy, Codeine, Ivp dye [iodinated contrast media], Percocet [oxycodone-acetaminophen ], Penicillin  g, Sulfamethoxazole-trimethoprim, Sumatriptan, and Topiramate    Review of Systems   Review of Systems  Physical Exam Updated Vital Signs BP (!) 145/93 (BP Location: Right Arm)   Pulse 94   Temp 98.2 F (36.8 C)   Resp 18   LMP 02/16/2015   SpO2 100%  Physical Exam  ED Results / Procedures / Treatments   Labs (all labs ordered are listed, but only abnormal results are displayed) Labs Reviewed -  No data to display  EKG None  Radiology No results found.  Procedures .Incision and Drainage  Date/Time: 06/09/2023 4:38 PM  Performed by: Shermon Warren SAILOR, PA-C Authorized by: Shermon Warren SAILOR, PA-C   Consent:    Consent obtained:  Verbal   Consent given by:  Patient   Risks, benefits, and alternatives were discussed: yes     Risks discussed:  Bleeding, damage to other organs, infection, incomplete drainage and pain   Alternatives discussed:  Referral, observation, alternative treatment, delayed treatment and no treatment Universal protocol:    Procedure explained and questions answered to  patient or proxy's satisfaction: yes     Relevant documents present and verified: yes     Test results available : yes     Imaging studies available: yes     Required blood products, implants, devices, and special equipment available: yes     Site/side marked: yes     Immediately prior to procedure, a time out was called: yes     Patient identity confirmed:  Verbally with patient Location:    Type:  Abscess   Location:  Upper extremity Pre-procedure details:    Skin preparation:  Antiseptic wash Sedation:    Sedation type:  None Anesthesia:    Anesthesia method:  Topical application and local infiltration   Topical anesthetic:  LET   Local anesthetic:  Lidocaine  1% w/o epi Procedure type:    Complexity:  Simple Procedure details:    Ultrasound guidance: no     Incision types:  Stab incision   Drainage:  Bloody and purulent   Drainage amount:  Moderate   Wound treatment:  Wound left open Post-procedure details:    Procedure completion:  Tolerated     Medications Ordered in ED Medications  lidocaine -EPINEPHrine -tetracaine  (LET) topical gel (3 mLs Topical Given 06/09/23 1540)  lidocaine  (PF) (XYLOCAINE ) 1 % injection 5 mL (5 mLs Infiltration Given by Other 06/09/23 1541)    ED Course/ Medical Decision Making/ A&P                                 Medical Decision Making Risk Prescription drug management.   Jillian Baker 47 y.o. presented today for a 2 cm abscess to their left armpit. Working Ddx: FB, fracture, NV compromise, simple laceration.  R/o DDx: These ddx are considered less likely due to history of present illness and physical exam findings.  Pmhx considered: HS  Patient presented for a 2 cm abscess to their left armpit. They are neurovascularly intact. Tetanus is UTD. Patient is in no distress. Laceration will be repaired with standard wound care procedures and antibiotic ointment.   Review of prior external notes: None  Labs: none, no sign of systemic  illness thus do not feel needed at this time.   Imaging: none   Problem List / ED Course / Critical interventions / Medication management  Reporting to emergency room with abscess.  Has been on 3 days of doxycycline  outpatient without any significant improvement.  Has history of same. No sign of systemic illness, stable vitals, no fever. Follows with dermatology for same.  Does not think she can get into derm within reasonable time frame.  Patient requesting incision and drainage.  Discussed referral and waiting dermatology, discussed risk and side effects. Patient understands and agrees to plan of I&D.  Will send home with outpatient antibiotics.  Incision and drainage was completed without any complications.  Patients vitals assessed. Upon arrival patient is hemodynamically stable.  I have reviewed the patients home medicines and have made adjustments as needed       Plan:  F/u with derm asap Patient is stable for discharge at this time. Patient/family expressed understanding of return precautions and need for follow-up.  Keep wound clean, covered. Educated on sx of concern regarding complications -- such as infection, poor wound healing, compartment sx.          Final Clinical Impression(s) / ED Diagnoses Final diagnoses:  Abscess    Rx / DC Orders ED Discharge Orders     None         Shermon Warren SAILOR, PA-C 06/09/23 1643    Hayes Czaja, Warren SAILOR, PA-C 06/09/23 1644    Ellouise Fine K, DO 06/11/23 0740

## 2023-06-09 NOTE — ED Triage Notes (Signed)
 Pt reports painful abscess to left armpit.  Pt has been taking doxycycline for it for 3 days with no relief.  AAOx4 NAD in triage.

## 2023-11-28 ENCOUNTER — Emergency Department (HOSPITAL_BASED_OUTPATIENT_CLINIC_OR_DEPARTMENT_OTHER): Admitting: Radiology

## 2023-11-28 ENCOUNTER — Emergency Department (HOSPITAL_BASED_OUTPATIENT_CLINIC_OR_DEPARTMENT_OTHER)
Admission: EM | Admit: 2023-11-28 | Discharge: 2023-11-29 | Disposition: A | Discharge status: Home / Self Care | Attending: Emergency Medicine | Admitting: Emergency Medicine

## 2023-11-28 ENCOUNTER — Other Ambulatory Visit: Payer: Self-pay

## 2023-11-28 ENCOUNTER — Emergency Department (HOSPITAL_BASED_OUTPATIENT_CLINIC_OR_DEPARTMENT_OTHER)

## 2023-11-28 DIAGNOSIS — R791 Abnormal coagulation profile: Secondary | ICD-10-CM | POA: Insufficient documentation

## 2023-11-28 DIAGNOSIS — R911 Solitary pulmonary nodule: Secondary | ICD-10-CM | POA: Diagnosis not present

## 2023-11-28 DIAGNOSIS — R0602 Shortness of breath: Secondary | ICD-10-CM | POA: Diagnosis present

## 2023-11-28 DIAGNOSIS — R7981 Abnormal blood-gas level: Secondary | ICD-10-CM | POA: Diagnosis not present

## 2023-11-28 DIAGNOSIS — R0781 Pleurodynia: Secondary | ICD-10-CM

## 2023-11-28 LAB — CBC
HCT: 42.4 % (ref 36.0–46.0)
Hemoglobin: 13.3 g/dL (ref 12.0–15.0)
MCH: 27.2 pg (ref 26.0–34.0)
MCHC: 31.4 g/dL (ref 30.0–36.0)
MCV: 86.7 fL (ref 80.0–100.0)
Platelets: 431 10*3/uL — ABNORMAL HIGH (ref 150–400)
RBC: 4.89 MIL/uL (ref 3.87–5.11)
RDW: 15.9 % — ABNORMAL HIGH (ref 11.5–15.5)
WBC: 7.8 10*3/uL (ref 4.0–10.5)
nRBC: 0 % (ref 0.0–0.2)

## 2023-11-28 LAB — BASIC METABOLIC PANEL WITH GFR
Anion gap: 13 (ref 5–15)
BUN: 17 mg/dL (ref 6–20)
CO2: 17 mmol/L — ABNORMAL LOW (ref 22–32)
Calcium: 9.3 mg/dL (ref 8.9–10.3)
Chloride: 109 mmol/L (ref 98–111)
Creatinine, Ser: 0.94 mg/dL (ref 0.44–1.00)
GFR, Estimated: >60 mL/min (ref >60)
Glucose, Bld: 77 mg/dL (ref 70–99)
Potassium: 3.9 mmol/L (ref 3.5–5.1)
Sodium: 139 mmol/L (ref 135–145)

## 2023-11-28 LAB — D-DIMER, QUANTITATIVE: D-Dimer, Quant: 0.66 ug{FEU}/mL — ABNORMAL HIGH (ref 0.00–0.50)

## 2023-11-28 LAB — RESP PANEL BY RT-PCR (RSV, FLU A&B, COVID)  RVPGX2
Influenza A by PCR: NEGATIVE
Influenza B by PCR: NEGATIVE
Resp Syncytial Virus by PCR: NEGATIVE
SARS Coronavirus 2 by RT PCR: NEGATIVE

## 2023-11-28 MED ORDER — IOHEXOL 350 MG/ML SOLN
75.0000 mL | Freq: Once | INTRAVENOUS | Status: AC | PRN
  Administered 2023-11-29: 75 mL via INTRAVENOUS

## 2023-11-28 MED ORDER — DIPHENHYDRAMINE HCL 50 MG/ML IJ SOLN
50.0000 mg | Freq: Once | INTRAMUSCULAR | Status: AC
  Filled 2023-11-28: qty 1

## 2023-11-28 MED ORDER — METHYLPREDNISOLONE SODIUM SUCC 40 MG IJ SOLR
40.0000 mg | Freq: Once | INTRAMUSCULAR | Status: AC
  Administered 2023-11-28: 40 mg via INTRAVENOUS
  Filled 2023-11-28: qty 1

## 2023-11-28 MED ORDER — DIPHENHYDRAMINE HCL 25 MG PO CAPS
50.0000 mg | ORAL_CAPSULE | Freq: Once | ORAL | Status: AC
  Administered 2023-11-29: 50 mg via ORAL
  Filled 2023-11-28: qty 2

## 2023-11-28 NOTE — Discharge Instructions (Addendum)
 You need to have a repeat CAT scan in 1 year to recheck the nodule that we saw on your scan today.  Your doctor can order this.

## 2023-11-28 NOTE — ED Provider Notes (Signed)
 [place] EMERGENCY DEPARTMENT AT [facility] Provider Note   CSN: [id number] Arrival date & time: 11/28/23  2025     Patient presents with: Shortness of Breath   Jillian Baker is a 48 y.o. female.  She is here with a complaint of some left-sided pain with deep breath and feeling short of breath it has been going on for a couple of days.  She said she was on a cruise and then traveled from [place]  back to here.  She denies any fevers chills nausea vomiting.  No cough.  Has tried nothing for her symptoms.  She said she has never had this before.  {Add pertinent medical, surgical, social history, OB history to YEP:[id number]} The history is provided by the patient.  Shortness of Breath Severity:  Moderate Onset quality:  Gradual Duration:  2 days Timing:  Constant Progression:  Unchanged Chronicity:  New Relieved by:  None tried Worsened by:  Nothing Ineffective treatments:  None tried Associated symptoms: chest pain   Associated symptoms: no cough, no fever, no sputum production, no vomiting and no wheezing   Risk factors: no tobacco use        Prior to Admission medications   Medication Sig Start Date End Date Taking? Authorizing Provider  buPROPion (WELLBUTRIN XL) 300 MG 24 hr tablet Take 1 tablet by mouth daily. 04/26/21   [provider]  busPIRone (BUSPAR) 5 MG tablet Take 5 mg by mouth 3 (three) times daily.    [provider]  dicyclomine  (BENTYL ) 20 MG tablet Take 1 tablet (20 mg total) by mouth 2 (two) times daily. 05/06/21   Hildegard, Amjad, PA-C  doxycycline  (VIBRAMYCIN ) 100 MG capsule Take 1 capsule (100 mg total) by mouth 2 (two) times daily. 06/09/23   Barrett, Warren SAILOR, PA-C  HUMIRA PEN 40 MG/0.4ML PNKT SMARTSIG:40 Milligram(s) SUB-Q Once a Week 06/25/21   [provider]  naproxen  (NAPROSYN ) 500 MG tablet Take 1 tablet (500 mg total) by mouth 2 (two) times daily. 03/02/22   Roselyn Carlin NOVAK, MD  ondansetron  (ZOFRAN ) 4 MG tablet Take 1  tablet (4 mg total) by mouth every 8 (eight) hours as needed for nausea or vomiting. 03/25/22   Tegeler, Lonni PARAS, MD  ondansetron  (ZOFRAN -ODT) 4 MG disintegrating tablet Take 1 tablet (4 mg total) by mouth every 8 (eight) hours as needed for nausea or vomiting. 05/06/21   Hildegard, Amjad, PA-C  potassium chloride  SA (KLOR-CON  M) 20 MEQ tablet Take 1 tablet (20 mEq total) by mouth 2 (two) times daily for 10 days. 01/24/23 02/03/23  Clark, Meghan R, PA-C  sertraline (ZOLOFT) 100 MG tablet Take 100 mg by mouth daily.    [provider]  sucralfate  (CARAFATE ) 1 g tablet Take 1 tablet (1 g total) by mouth 4 (four) times daily -  with meals and at bedtime. 01/24/23 02/23/23  Clark, Gerard R, PA-C  tizanidine (ZANAFLEX) 2 MG capsule Take 2 mg by mouth 3 (three) times daily.    [provider]  topiramate (TOPAMAX) 100 MG tablet Take 100 mg by mouth daily.    [provider]  traMADol  (ULTRAM ) 50 MG tablet Take 50 mg by mouth every 6 (six) hours as needed for moderate pain.    [provider]    Allergies: Nystatin, Oxycodone, Shellfish allergy, Codeine, Ivp dye [iodinated contrast media], Percocet [oxycodone-acetaminophen ], Penicillin  g, Sulfamethoxazole-trimethoprim, Sumatriptan, and Topiramate    Review of Systems  Constitutional:  Negative for fever.  Respiratory:  Positive for shortness  of breath. Negative for cough, sputum production and wheezing.   Cardiovascular:  Positive for chest pain.  Gastrointestinal:  Negative for vomiting.    Updated Vital Signs BP 122/78 (BP Location: Right Arm)   Pulse 71   Temp 98 F (36.7 C) (Oral)   Resp 18   Ht 5' (1.524 m)   Wt 80.7 kg   LMP 02/16/2015   SpO2 100%   BMI 34.76 kg/m   Physical Exam Vitals and nursing note reviewed.  Constitutional:      General: She is not in acute distress.    Appearance: Normal appearance. She is well-developed.  HENT:     Head: Normocephalic and atraumatic.   Eyes:      Conjunctiva/sclera: Conjunctivae normal.    Cardiovascular:     Rate and Rhythm: Normal rate and regular rhythm.     Heart sounds: No murmur heard. Pulmonary:     Effort: Pulmonary effort is normal. No respiratory distress.     Breath sounds: Normal breath sounds. No stridor. No wheezing.  Abdominal:     Palpations: Abdomen is soft.     Tenderness: There is no abdominal tenderness. There is no guarding or rebound.   Musculoskeletal:        General: No tenderness or deformity. Normal range of motion.     Cervical back: Neck supple.     Right lower leg: No tenderness. No edema.     Left lower leg: No tenderness. No edema.   Skin:    General: Skin is warm and dry.     Capillary Refill: Capillary refill takes less than 2 seconds.   Neurological:     General: No focal deficit present.     Mental Status: She is alert.     GCS: GCS eye subscore is 4. GCS verbal subscore is 5. GCS motor subscore is 6.     (all labs ordered are listed, but only abnormal results are displayed) Labs Reviewed  BASIC METABOLIC PANEL WITH GFR - Abnormal; Notable for the following components:      Result Value   CO2 17 (*)    All other components within normal limits  CBC - Abnormal; Notable for the following components:   RDW 15.9 (*)    Platelets 431 (*)    All other components within normal limits  D-DIMER, QUANTITATIVE - Abnormal; Notable for the following components:   D-Dimer, Quant 0.66 (*)    All other components within normal limits    EKG: None  Radiology: DG Chest 2 View Result Date: 11/28/2023 CLINICAL DATA:  Shortness of breath EXAM: CHEST - 2 VIEW COMPARISON:  03/01/2022 FINDINGS: Cardiac shadow is within normal limits. The lungs are well aerated bilaterally. No focal infiltrate or effusion is seen. No bony abnormality is noted. IMPRESSION: No active cardiopulmonary disease. Electronically Signed   By: Oneil Devonshire M.D.   On: 11/28/2023 20:58    {Document cardiac monitor, telemetry  assessment procedure when appropriate:32947} Procedures   Medications Ordered in the ED - No data to display  Clinical Course as of 11/28/23 2247  Mon Nov 28, 2023  2219 EKG did not cross in epic.  Sinus rhythm rate of 66 no acute changes from prior [MB]  2237 Patient's D-dimer mildly elevated.  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  We had a long discussion of risks and benefits.  She would like to proceed with getting the pretreatment and undergoing the CT. [MB]    Clinical Course User Index [MB]  Towana Ozell BROCKS, MD   {Click here for ABCD2, HEART and other calculators REFRESH Note before signing:1}                              Medical Decision Making Amount and/or Complexity of Data Reviewed Labs: ordered. Radiology: ordered.   This patient complains of ***; this involves an extensive number of treatment Options and is a complaint that carries with it a high risk of complications and morbidity. The differential includes ***  I ordered, reviewed and interpreted labs, which included *** I ordered medication *** and reviewed PMP when indicated. I ordered imaging studies which included *** and I independently    visualized and interpreted imaging which showed *** Additional history obtained from *** Previous records obtained and reviewed *** I consulted *** and discussed lab and imaging findings and discussed disposition.  Cardiac monitoring reviewed, *** Social determinants considered, *** Critical Interventions: ***  After the interventions stated above, I reevaluated the patient and found *** Admission and further testing considered, ***   {Document critical care time when appropriate  Document review of labs and clinical decision tools ie CHADS2VASC2, etc  Document your independent review of radiology images and any outside records  Document your discussion with family members, caretakers and with consultants  Document social determinants of health affecting pt's care  Document  your decision making why or why not admission, treatments were needed:32947:::1}   Final diagnoses:  None    ED Discharge Orders     None

## 2023-11-28 NOTE — ED Triage Notes (Signed)
 Pt POV reporting SOB and chest pain upon inspiration x3 days, recently returned from cruise, pt drive to [place]  for departure.

## 2023-11-29 ENCOUNTER — Emergency Department (HOSPITAL_BASED_OUTPATIENT_CLINIC_OR_DEPARTMENT_OTHER)

## 2023-11-29 NOTE — ED Notes (Signed)
 Radiology notified of pretreatment. Okay for CT scan at 0315

## 2023-11-29 NOTE — ED Provider Notes (Signed)
 Case signed out to me by Dr. Towana.  Patient with previous history of DVT presented with pleuritic chest pain.  She is not currently on anticoagulation.  She required premedication because of iohexol  allergy.  CT angiography has now been performed.  No evidence of PE.  No acute pathology noted, does have a pulmonary nodule.  Recommendation for 1 year follow-up for this.   Haze Lonni PARAS, MD 11/29/23 [phone]

## 2023-11-29 NOTE — ED Notes (Signed)
 Pt ambulated to bathroom without assistance
